# Patient Record
Sex: Male | Born: 1950 | Race: Black or African American | Hispanic: No | Marital: Married | Smoking: Former smoker
Health system: Southern US, Community
[De-identification: ages and names within clinical notes are randomized; demographics above are authoritative.]

## PROBLEM LIST (undated history)

## (undated) DIAGNOSIS — I10 Essential (primary) hypertension: Secondary | ICD-10-CM

## (undated) DIAGNOSIS — J40 Bronchitis, not specified as acute or chronic: Secondary | ICD-10-CM

## (undated) DIAGNOSIS — K259 Gastric ulcer, unspecified as acute or chronic, without hemorrhage or perforation: Secondary | ICD-10-CM

## (undated) DIAGNOSIS — D649 Anemia, unspecified: Secondary | ICD-10-CM

## (undated) DIAGNOSIS — J45909 Unspecified asthma, uncomplicated: Secondary | ICD-10-CM

## (undated) DIAGNOSIS — N189 Chronic kidney disease, unspecified: Secondary | ICD-10-CM

## (undated) DIAGNOSIS — K759 Inflammatory liver disease, unspecified: Secondary | ICD-10-CM

## (undated) DIAGNOSIS — M1990 Unspecified osteoarthritis, unspecified site: Secondary | ICD-10-CM

## (undated) HISTORY — PX: AV FISTULA PLACEMENT: SHX1204

## (undated) HISTORY — PX: UPPER GI ENDOSCOPY: SHX6162

## (undated) HISTORY — DX: Chronic kidney disease, unspecified: N18.9

## (undated) HISTORY — PX: ARTERIOVENOUS GRAFT PLACEMENT: SUR1029

## (undated) HISTORY — PX: COLONOSCOPY: SHX174

## (undated) HISTORY — PX: KNEE ARTHROSCOPY: SHX127

---

## 2000-03-27 ENCOUNTER — Inpatient Hospital Stay (HOSPITAL_COMMUNITY): Admission: EM | Admit: 2000-03-27 | Discharge: 2000-03-31 | Payer: Self-pay | Admitting: Emergency Medicine

## 2004-01-09 ENCOUNTER — Inpatient Hospital Stay (HOSPITAL_COMMUNITY): Admission: EM | Admit: 2004-01-09 | Discharge: 2004-01-11 | Payer: Self-pay | Admitting: Emergency Medicine

## 2004-01-09 ENCOUNTER — Ambulatory Visit: Payer: Self-pay | Admitting: Internal Medicine

## 2006-10-21 ENCOUNTER — Emergency Department (HOSPITAL_COMMUNITY): Admission: EM | Admit: 2006-10-21 | Discharge: 2006-10-21 | Payer: Self-pay | Admitting: Emergency Medicine

## 2010-06-21 NOTE — H&P (Signed)
[facility]  Patient:    Timothy Lane, Timothy Lane                       MRN: [id number] Adm. Date:  [id number] Attending:  Linna Darner CC:         Darrick Penna. Swords, M.D. [facility]   History and Physical  Timothy Lane is a 60 year old Afro-American male admitted with uncontrolled diabetes with glucoses over 800.  Timothy Lane turned 37 on [birth year] 07, 2000 and admits to "partying since."  This has involved unquantitative amounts of alcohol and the use of drugs such as cocaine.  His cigarette consumption has varied from a pack or more depending on "how much coke I have smoked."  He now has been nauseated for four to five days and has vomited on three occasions.  He has had profound polydypsia.  On February 21 he drank three gallons of water, half gallon of juice, three sodas, and four beers by his account.  He has also had associated polyuria and extreme hunger.  Since the eighth or ninth he has had blurred vision.  He has had varying degrees of being "hot and cold," but no definite fever and no purulent secretions.  He had diabetes in 1997 and stated that he was able to control it with diet.  He began to use his glucometer and found glucoses ranging from over 200 to over 400.  Because of this he bought a new glucometer March 25, 2000 which registered "high."  When he called the 1-800 number they told him his sugar was between 600 and 800 and recommended a doctors visit.  He called Dr. Leanne Chang office February 20 and was told he would be worked in February 21.  After waiting for an unquantitative period of time he left without being seen.  PAST MEDICAL HISTORY:  Knee surgery x 2.  He left the [employer] in 1974 with a 10% disability for the right knee.  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   He apparently had a meniscectomy.  He stated this was related to putting "too much weight on my good knee."  He has been disabled for the last 12-13 years.  He states that he will have some itching of his chest and groin area at times and is contemplating having his case reopened to reassess level of disability.  FAMILY HISTORY:  Positive for stroke in his mother.  His father had pancreatic cancer found on autopsy which was done after he was murdered.  ALLERGIES:  No known drug allergies.  SOCIAL HISTORY:  He does not quantitate his alcohol intake but states "I drink whatever is around."  He had a "cold" in his throat one week ago which was not treated.  He denies any secretions.  On one occasion he had pain on the right side of his chest after pushing a couch which he described as "restricted feeling" which was tender to the touch.  He has also had nonspecific variable abdominal pain of a cramping nature with his nausea and vomiting.  He has arthritis in his knees and takes ibuprofen.  He has gout in his big toe and knuckle and takes indomethacin.  He has had a 30 pound weight loss since the diagnosis of diabetes  in 1997.  He describes the itch over the chest.  PHYSICAL EXAMINATION  VITAL SIGNS:  Blood pressure 119/81, pulse 87, respiratory rate 20, temperature 97.6.  HEENT:  He has been arcus senilis.  There is poor visualization of fundi. Dental hygiene is fair.  There is increased cerumen in the otic canals.  The otolaryngologic examination is otherwise unremarkable.  NECK:  No lymphadenopathy about the head, neck, or axilla.  LUNGS:  He has minimal rales at the bases with no increased work of breathing.  HEART:  S4 is present without significant murmur.  ABDOMEN:  Slightly protuberant without organomegaly or masses.  SKIN:  Clear over the abdomen and chest.  GENITAL:  Initially deferred.  RECTAL:  Initially deferred.  EXTREMITIES:  There is crepitus in the  knees.  He has full range of motion and no localizing neurologic signs.  Pedal pulses are good.  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  There is no weeping or drainage.  LABORATORIES:  Blood acetone was negative.  Arterial blood gases on room air showed PO2 95.4, PCO2 32.8, pH 7.484.  Urine revealed greater than 1000 mg/dl of glucose.  CBC and differential revealed a platelet count of 142,000, but was otherwise normal.  His drug screen was positive for cocaine.  Sodium 128, potassium 4.2, glucose 864, BUN 27, creatinine 1.3.  He is admitted with nonketotic uncontrolled diabetes in a setting of drug, alcohol, and nicotine abuse.  He will receive a sliding scale coverage.  With treatment in the emergency room his glucose has already dropped to 397.  There is a family history of stroke and he would be at high risk of premature cardiovascular disease or neurovascular disease.  This was discussed with him.  His chest pain is atypical, but as diabetics seem to have myocardial insult without pain, EKG and enzymes will also be collected.  His prognosis depends on compliance with dietary/medical regimen.  His behavior and lifestyle are antisocial as noted above.  His thought process is disturbing too in that he is considering reevaluation for additional disability for dermatologic symptoms that are most likely related to his uncontrolled diabetes.  He is intelligent and did express that he understood that his prognosis depended on his motivation and involvement.  Following discharge he will be seen by Dr. Leanne Chang at the ______ clinic. DD:  03/27/00 TD:  03/28/00 Job: [id number]

## 2010-06-21 NOTE — Discharge Summary (Signed)
[facility]  Patient:    Timothy Lane, Timothy Lane                       MRN: [id number] Adm. Date:  [id number] Disc. Date: [id number] Attending:  Linna Darner Dictator:   Donaciano Eva, N.P. CC:         Darrick Penna. Swords, M.D. [facility]  Darrick Penna. Linna Darner, M.D. [facility]   Discharge Summary  ADMITTING DIAGNOSES: 1. Nonketotic uncontrolled diabetes. 2. Drug abuse. 3. Alcohol abuse. 4. Nicotine abuse.  DISCHARGE MEDICATIONS: 1. Glucophage XR two p.o. q.a.m. 2. Humulin N 40 units in the a.m., 20 units in the p.m. 3. Humulin regular eight units to each injection of the Humulin N.  DIET:  1600 kcal ADA diet.  FOLLOWUP:  He is to visit his primary physician in one week.  HISTORY OF PRESENT ILLNESS:  The patient was experienced glucoses of above 800, nausea for four to five days, vomiting x 3 days and polydipsia all prior to admission.  LABORATORY DATA AND X-RAY FINDINGS:  Blood glucose of 864, BUN 27, sodium 128. These continued to improve throughout his hospitalization.  Of note, his hemoglobin A1C was 9.9.  His cardiac markers were negative.  His toxicology was positive for cocaine.  HOSPITAL COURSE:  During his hospitalization, his blood sugars continued to normalize using sliding scale therapy.  The patient was instructed on care of his diabetic state.  He received education by the diabetes coordinator as well as the nutritionist.  He demonstrated appropriate technique in injection.  His vital signs were stable.  He was being discharged in stable condition to follow up with his physician for diabetes education in one week. DD:  03/31/00 TD:  04/01/00 Job: [id number]

## 2010-11-14 LAB — I-STAT 8, (EC8 V) (CONVERTED LAB)
Acid-Base Excess: 5 — ABNORMAL HIGH
BUN: 8
Bicarbonate: 32.4 — ABNORMAL HIGH
Chloride: 102
Glucose, Bld: 53 — ABNORMAL LOW
HCT: 49
Hemoglobin: 16.7
Operator id: 288331
Potassium: 3 — ABNORMAL LOW
Sodium: 139
TCO2: 34
pCO2, Ven: 56.5 — ABNORMAL HIGH
pH, Ven: 7.366 — ABNORMAL HIGH

## 2010-11-14 LAB — CBC
HCT: 46.6
Hemoglobin: 16
MCHC: 34.2
MCV: 87.9
Platelets: 133 — ABNORMAL LOW
RBC: 5.3
RDW: 13.2
WBC: 8

## 2010-11-14 LAB — POCT CARDIAC MARKERS
CKMB, poc: <1 — ABNORMAL LOW
CKMB, poc: <1 — ABNORMAL LOW
Myoglobin, poc: 62.3
Myoglobin, poc: 86.5
Operator id: 288331
Operator id: 288331
Troponin i, poc: <0.05
Troponin i, poc: <0.05

## 2010-11-14 LAB — URINALYSIS, ROUTINE W REFLEX MICROSCOPIC
Glucose, UA: >1000 — AB
Hgb urine dipstick: NEGATIVE
Ketones, ur: 15 — AB
Nitrite: POSITIVE — AB
Protein, ur: 100 — AB
Specific Gravity, Urine: 1.025
Urobilinogen, UA: 1
pH: 5

## 2010-11-14 LAB — DIFFERENTIAL
Basophils Absolute: 0
Basophils Relative: 0
Eosinophils Absolute: 0.3
Eosinophils Relative: 3
Lymphocytes Relative: 20
Lymphs Abs: 1.6
Monocytes Absolute: 0.7
Monocytes Relative: 9
Neutro Abs: 5.4
Neutrophils Relative %: 67

## 2010-11-14 LAB — HEPATIC FUNCTION PANEL
ALT: 33
AST: 28
Albumin: 4
Alkaline Phosphatase: 100
Bilirubin, Direct: 0.2
Indirect Bilirubin: 1.3 — ABNORMAL HIGH
Total Bilirubin: 1.5 — ABNORMAL HIGH
Total Protein: 7.7

## 2010-11-14 LAB — LIPASE, BLOOD: Lipase: 32

## 2010-11-14 LAB — POCT I-STAT CREATININE
Creatinine, Ser: 1.4
Operator id: 288331

## 2010-11-14 LAB — AMYLASE: Amylase: 111

## 2010-11-14 LAB — URINE MICROSCOPIC-ADD ON

## 2010-11-14 LAB — ETHANOL

## 2010-11-14 LAB — URINE CULTURE: Colony Count: 15000

## 2011-07-24 ENCOUNTER — Other Ambulatory Visit: Payer: Self-pay | Admitting: Orthopedic Surgery

## 2011-08-08 ENCOUNTER — Ambulatory Visit

## 2011-09-01 ENCOUNTER — Encounter (HOSPITAL_COMMUNITY)
Admission: RE | Admit: 2011-09-01 | Discharge: 2011-09-01 | Payer: Self-pay | Source: Ambulatory Visit | Attending: Orthopedic Surgery | Admitting: Orthopedic Surgery

## 2011-09-01 NOTE — Pre-Procedure Instructions (Signed)
[place]  09/01/2011   Your procedure is scheduled on:  Mon, Aug 5 @ 7:30 AM  Report to [place] at 5:30 AM.  Call this number if you have problems the morning of surgery: [phone]   Remember:   Do not eat food:After Midnight.               Do not wear jewelry  Do not wear lotions, powders, or colognes.  Men may shave face and neck.  Do not bring valuables to the hospital.  Contacts, dentures or bridgework may not be worn into surgery.  Leave suitcase in the car. After surgery it may be brought to your room.  For patients admitted to the hospital, checkout time is 11:00 AM the day of discharge.   Patients discharged the day of surgery will not be allowed to drive home.    Special Instructions: Incentive Spirometry - Practice and bring it with you on the day of surgery. and CHG Shower Use Special Wash: 1/2 bottle night before surgery and 1/2 bottle morning of surgery.   Please read over the following fact sheets that you were given: Pain Booklet, Coughing and Deep Breathing, Blood Transfusion Information, Total Joint Packet, MRSA Information and Surgical Site Infection Prevention

## 2011-09-02 ENCOUNTER — Encounter (HOSPITAL_COMMUNITY): Payer: Self-pay | Admitting: Pharmacy Technician

## 2011-09-04 ENCOUNTER — Encounter (HOSPITAL_COMMUNITY)
Admission: RE | Admit: 2011-09-04 | Discharge: 2011-09-04 | Disposition: A | Discharge status: Home / Self Care | Source: Ambulatory Visit | Attending: Orthopedic Surgery | Admitting: Orthopedic Surgery

## 2011-09-04 ENCOUNTER — Encounter (HOSPITAL_COMMUNITY): Payer: Self-pay

## 2011-09-04 HISTORY — DX: Essential (primary) hypertension: I10

## 2011-09-04 HISTORY — DX: Bronchitis, not specified as acute or chronic: J40

## 2011-09-04 HISTORY — DX: Unspecified asthma, uncomplicated: J45.909

## 2011-09-04 HISTORY — DX: Inflammatory liver disease, unspecified: K75.9

## 2011-09-04 HISTORY — DX: Unspecified osteoarthritis, unspecified site: M19.90

## 2011-09-04 HISTORY — DX: Gastric ulcer, unspecified as acute or chronic, without hemorrhage or perforation: K25.9

## 2011-09-04 LAB — ABO/RH: ABO/RH(D): O POS

## 2011-09-04 LAB — DIFFERENTIAL
Eosinophils Relative: 17 % — ABNORMAL HIGH (ref 0–5)
Lymphocytes Relative: 26 % (ref 12–46)
Lymphs Abs: 1.1 10*3/uL (ref 0.7–4.0)
Monocytes Absolute: 0.3 10*3/uL (ref 0.1–1.0)
Neutro Abs: 2.1 10*3/uL (ref 1.7–7.7)

## 2011-09-04 LAB — URINALYSIS, ROUTINE W REFLEX MICROSCOPIC
Hgb urine dipstick: NEGATIVE
Leukocytes, UA: NEGATIVE
Specific Gravity, Urine: 1.015 (ref 1.005–1.030)
Urobilinogen, UA: 1 mg/dL (ref 0.0–1.0)

## 2011-09-04 LAB — CBC
MCH: 31 pg (ref 26.0–34.0)
MCHC: 35 g/dL (ref 30.0–36.0)
Platelets: 109 10*3/uL — ABNORMAL LOW (ref 150–400)
RDW: 13.7 % (ref 11.5–15.5)

## 2011-09-04 LAB — COMPREHENSIVE METABOLIC PANEL
ALT: 76 U/L — ABNORMAL HIGH (ref 0–53)
AST: 74 U/L — ABNORMAL HIGH (ref 0–37)
Calcium: 9.7 mg/dL (ref 8.4–10.5)
GFR calc Af Amer: 74 mL/min — ABNORMAL LOW (ref >90)
Sodium: 142 mEq/L (ref 135–145)
Total Protein: 7.6 g/dL (ref 6.0–8.3)

## 2011-09-04 LAB — PROTIME-INR: Prothrombin Time: 12.9 seconds (ref 11.6–15.2)

## 2011-09-04 LAB — SURGICAL PCR SCREEN
MRSA, PCR: NEGATIVE
Staphylococcus aureus: NEGATIVE

## 2011-09-04 MED ORDER — CHLORHEXIDINE GLUCONATE 4 % EX LIQD: 60.0000 mL | Freq: Once | CUTANEOUS | Status: DC

## 2011-09-07 MED ORDER — CEFAZOLIN SODIUM-DEXTROSE 2-3 GM-% IV SOLR
2.0000 g | INTRAVENOUS | Status: AC
  Administered 2011-09-08: 2 g via INTRAVENOUS
  Filled 2011-09-07: qty 50

## 2011-09-08 ENCOUNTER — Ambulatory Visit (HOSPITAL_COMMUNITY): Admitting: Anesthesiology

## 2011-09-08 ENCOUNTER — Encounter (HOSPITAL_COMMUNITY)
Admission: RE | Disposition: A | Discharge status: Home / Self Care | Payer: Self-pay | Source: Ambulatory Visit | Attending: Orthopedic Surgery

## 2011-09-08 ENCOUNTER — Inpatient Hospital Stay (HOSPITAL_COMMUNITY)
Admission: RE | Admit: 2011-09-08 | Discharge: 2011-09-11 | DRG: 470 | Disposition: A | Discharge status: Home / Self Care | Source: Ambulatory Visit | Attending: Orthopedic Surgery | Admitting: Orthopedic Surgery

## 2011-09-08 ENCOUNTER — Encounter (HOSPITAL_COMMUNITY): Payer: Self-pay | Admitting: Anesthesiology

## 2011-09-08 ENCOUNTER — Encounter (HOSPITAL_COMMUNITY): Payer: Self-pay | Admitting: *Deleted

## 2011-09-08 ENCOUNTER — Encounter (HOSPITAL_COMMUNITY): Payer: Self-pay

## 2011-09-08 DIAGNOSIS — B1920 Unspecified viral hepatitis C without hepatic coma: Secondary | ICD-10-CM

## 2011-09-08 DIAGNOSIS — E1165 Type 2 diabetes mellitus with hyperglycemia: Secondary | ICD-10-CM

## 2011-09-08 DIAGNOSIS — E1149 Type 2 diabetes mellitus with other diabetic neurological complication: Secondary | ICD-10-CM | POA: Diagnosis present

## 2011-09-08 DIAGNOSIS — Z96659 Presence of unspecified artificial knee joint: Secondary | ICD-10-CM

## 2011-09-08 DIAGNOSIS — I10 Essential (primary) hypertension: Secondary | ICD-10-CM | POA: Diagnosis present

## 2011-09-08 DIAGNOSIS — M1711 Unilateral primary osteoarthritis, right knee: Secondary | ICD-10-CM

## 2011-09-08 DIAGNOSIS — Z720 Tobacco use: Secondary | ICD-10-CM

## 2011-09-08 DIAGNOSIS — D62 Acute posthemorrhagic anemia: Secondary | ICD-10-CM | POA: Diagnosis not present

## 2011-09-08 DIAGNOSIS — E1140 Type 2 diabetes mellitus with diabetic neuropathy, unspecified: Secondary | ICD-10-CM

## 2011-09-08 DIAGNOSIS — Z8711 Personal history of peptic ulcer disease: Secondary | ICD-10-CM

## 2011-09-08 DIAGNOSIS — Z01812 Encounter for preprocedural laboratory examination: Secondary | ICD-10-CM

## 2011-09-08 DIAGNOSIS — E1142 Type 2 diabetes mellitus with diabetic polyneuropathy: Secondary | ICD-10-CM | POA: Diagnosis present

## 2011-09-08 DIAGNOSIS — J45909 Unspecified asthma, uncomplicated: Secondary | ICD-10-CM | POA: Diagnosis present

## 2011-09-08 DIAGNOSIS — M1710 Unilateral primary osteoarthritis, unspecified knee: Principal | ICD-10-CM | POA: Diagnosis present

## 2011-09-08 DIAGNOSIS — F17200 Nicotine dependence, unspecified, uncomplicated: Secondary | ICD-10-CM | POA: Diagnosis present

## 2011-09-08 HISTORY — PX: TOTAL KNEE ARTHROPLASTY: SHX125

## 2011-09-08 LAB — GLUCOSE, CAPILLARY
Glucose-Capillary: 129 mg/dL — ABNORMAL HIGH (ref 70–99)
Glucose-Capillary: 159 mg/dL — ABNORMAL HIGH (ref 70–99)
Glucose-Capillary: 210 mg/dL — ABNORMAL HIGH (ref 70–99)

## 2011-09-08 LAB — HEMOGLOBIN A1C: Mean Plasma Glucose: 160 mg/dL — ABNORMAL HIGH (ref <117)

## 2011-09-08 SURGERY — ARTHROPLASTY, KNEE, TOTAL
Anesthesia: Regional | Site: Knee | Laterality: Left | Wound class: Clean

## 2011-09-08 MED ORDER — ACETAMINOPHEN 10 MG/ML IV SOLN
1000.0000 mg | Freq: Four times a day (QID) | INTRAVENOUS | Status: DC
  Administered 2011-09-08: 500 mg via INTRAVENOUS
  Filled 2011-09-08 (×3): qty 100

## 2011-09-08 MED ORDER — ONDANSETRON HCL 4 MG/2ML IJ SOLN
4.0000 mg | Freq: Four times a day (QID) | INTRAMUSCULAR | Status: DC | PRN
  Administered 2011-09-08 (×2): 4 mg via INTRAVENOUS
  Filled 2011-09-08 (×2): qty 2

## 2011-09-08 MED ORDER — FENTANYL CITRATE 0.05 MG/ML IJ SOLN
INTRAMUSCULAR | Status: DC | PRN
  Administered 2011-09-08: 100 ug via INTRAVENOUS
  Administered 2011-09-08 (×2): 50 ug via INTRAVENOUS

## 2011-09-08 MED ORDER — LIDOCAINE HCL (CARDIAC) 20 MG/ML IV SOLN
INTRAVENOUS | Status: DC | PRN
  Administered 2011-09-08: 80 mg via INTRAVENOUS

## 2011-09-08 MED ORDER — ACETAMINOPHEN 325 MG PO TABS: 650.0000 mg | ORAL_TABLET | Freq: Four times a day (QID) | ORAL | Status: DC | PRN

## 2011-09-08 MED ORDER — LACTATED RINGERS IV SOLN
INTRAVENOUS | Status: DC | PRN
  Administered 2011-09-08 (×2): via INTRAVENOUS

## 2011-09-08 MED ORDER — ONDANSETRON HCL 4 MG/2ML IJ SOLN
INTRAMUSCULAR | Status: DC | PRN
  Administered 2011-09-08: 4 mg via INTRAVENOUS

## 2011-09-08 MED ORDER — LISINOPRIL 20 MG PO TABS
20.0000 mg | ORAL_TABLET | Freq: Every day | ORAL | Status: DC
  Administered 2011-09-08 – 2011-09-11 (×4): 20 mg via ORAL
  Filled 2011-09-08 (×4): qty 1

## 2011-09-08 MED ORDER — ENOXAPARIN SODIUM 30 MG/0.3ML ~~LOC~~ SOLN
30.0000 mg | Freq: Two times a day (BID) | SUBCUTANEOUS | Status: DC
  Administered 2011-09-09 – 2011-09-11 (×5): 30 mg via SUBCUTANEOUS
  Filled 2011-09-08 (×8): qty 0.3

## 2011-09-08 MED ORDER — OXYCODONE HCL 10 MG PO TB12
10.0000 mg | ORAL_TABLET | Freq: Two times a day (BID) | ORAL | Status: DC
  Administered 2011-09-08 – 2011-09-11 (×7): 10 mg via ORAL
  Filled 2011-09-08 (×7): qty 1

## 2011-09-08 MED ORDER — GABAPENTIN 100 MG PO CAPS
200.0000 mg | ORAL_CAPSULE | Freq: Three times a day (TID) | ORAL | Status: DC
  Administered 2011-09-08 – 2011-09-11 (×9): 200 mg via ORAL
  Filled 2011-09-08 (×12): qty 2

## 2011-09-08 MED ORDER — INSULIN ASPART 100 UNIT/ML ~~LOC~~ SOLN
0.0000 [IU] | Freq: Every day | SUBCUTANEOUS | Status: DC
  Administered 2011-09-08: 2 [IU] via SUBCUTANEOUS
  Administered 2011-09-09: 4 [IU] via SUBCUTANEOUS

## 2011-09-08 MED ORDER — BUPIVACAINE-EPINEPHRINE 0.25% -1:200000 IJ SOLN
INTRAMUSCULAR | Status: DC | PRN
  Administered 2011-09-08: 20 mL

## 2011-09-08 MED ORDER — ROCURONIUM BROMIDE 100 MG/10ML IV SOLN
INTRAVENOUS | Status: DC | PRN
  Administered 2011-09-08: 50 mg via INTRAVENOUS

## 2011-09-08 MED ORDER — LIDOCAINE HCL 4 % MT SOLN
OROMUCOSAL | Status: DC | PRN
  Administered 2011-09-08: 4 mL via TOPICAL

## 2011-09-08 MED ORDER — METHOCARBAMOL 100 MG/ML IJ SOLN
500.0000 mg | Freq: Four times a day (QID) | INTRAVENOUS | Status: DC | PRN
  Filled 2011-09-08: qty 5

## 2011-09-08 MED ORDER — METOCLOPRAMIDE HCL 5 MG/ML IJ SOLN: 5.0000 mg | Freq: Three times a day (TID) | INTRAMUSCULAR | Status: DC | PRN

## 2011-09-08 MED ORDER — PHENOL 1.4 % MT LIQD: 1.0000 | OROMUCOSAL | Status: DC | PRN

## 2011-09-08 MED ORDER — ALUM & MAG HYDROXIDE-SIMETH 200-200-20 MG/5ML PO SUSP: 30.0000 mL | ORAL | Status: DC | PRN

## 2011-09-08 MED ORDER — ACETAMINOPHEN 10 MG/ML IV SOLN
INTRAVENOUS | Status: AC
  Filled 2011-09-08: qty 100

## 2011-09-08 MED ORDER — BISACODYL 5 MG PO TBEC
5.0000 mg | DELAYED_RELEASE_TABLET | Freq: Every day | ORAL | Status: DC | PRN
  Administered 2011-09-10: 5 mg via ORAL
  Filled 2011-09-08: qty 1

## 2011-09-08 MED ORDER — OXYCODONE HCL 5 MG PO TABS
5.0000 mg | ORAL_TABLET | ORAL | Status: DC | PRN
  Administered 2011-09-09 – 2011-09-11 (×7): 10 mg via ORAL
  Filled 2011-09-08 (×7): qty 2

## 2011-09-08 MED ORDER — SENNOSIDES-DOCUSATE SODIUM 8.6-50 MG PO TABS: 1.0000 | ORAL_TABLET | Freq: Every evening | ORAL | Status: DC | PRN

## 2011-09-08 MED ORDER — HYDROMORPHONE HCL PF 1 MG/ML IJ SOLN
INTRAMUSCULAR | Status: AC
  Filled 2011-09-08: qty 1

## 2011-09-08 MED ORDER — DIPHENHYDRAMINE HCL 12.5 MG/5ML PO ELIX: 12.5000 mg | ORAL_SOLUTION | ORAL | Status: DC | PRN

## 2011-09-08 MED ORDER — CELECOXIB 200 MG PO CAPS
200.0000 mg | ORAL_CAPSULE | Freq: Two times a day (BID) | ORAL | Status: DC
  Administered 2011-09-08 – 2011-09-11 (×7): 200 mg via ORAL
  Filled 2011-09-08 (×8): qty 1

## 2011-09-08 MED ORDER — ONDANSETRON HCL 4 MG PO TABS
4.0000 mg | ORAL_TABLET | Freq: Four times a day (QID) | ORAL | Status: DC | PRN
  Administered 2011-09-09 – 2011-09-11 (×4): 4 mg via ORAL
  Filled 2011-09-08 (×4): qty 1

## 2011-09-08 MED ORDER — GLYCOPYRROLATE 0.2 MG/ML IJ SOLN
INTRAMUSCULAR | Status: DC | PRN
  Administered 2011-09-08: .8 mg via INTRAVENOUS

## 2011-09-08 MED ORDER — BUPIVACAINE 0.25 % ON-Q PUMP SINGLE CATH 300ML
300.0000 mL | INJECTION | Status: AC
  Administered 2011-09-08: 300 mL
  Filled 2011-09-08: qty 300

## 2011-09-08 MED ORDER — INSULIN ASPART 100 UNIT/ML ~~LOC~~ SOLN
4.0000 [IU] | Freq: Three times a day (TID) | SUBCUTANEOUS | Status: DC
  Administered 2011-09-09 (×3): 4 [IU] via SUBCUTANEOUS

## 2011-09-08 MED ORDER — DOCUSATE SODIUM 100 MG PO CAPS
100.0000 mg | ORAL_CAPSULE | Freq: Two times a day (BID) | ORAL | Status: DC
Start: 2011-09-08 — End: 2011-09-11
  Administered 2011-09-08 – 2011-09-11 (×6): 100 mg via ORAL
  Filled 2011-09-08 (×7): qty 1

## 2011-09-08 MED ORDER — ACETAMINOPHEN 650 MG RE SUPP: 650.0000 mg | Freq: Four times a day (QID) | RECTAL | Status: DC | PRN

## 2011-09-08 MED ORDER — INSULIN ASPART 100 UNIT/ML ~~LOC~~ SOLN
0.0000 [IU] | Freq: Three times a day (TID) | SUBCUTANEOUS | Status: DC
  Administered 2011-09-08 (×2): 2 [IU] via SUBCUTANEOUS
  Administered 2011-09-09: 5 [IU] via SUBCUTANEOUS
  Administered 2011-09-09 (×2): 2 [IU] via SUBCUTANEOUS

## 2011-09-08 MED ORDER — SODIUM CHLORIDE 0.9 % IR SOLN
Status: DC | PRN
  Administered 2011-09-08: 3000 mL
  Administered 2011-09-08: 1000 mL

## 2011-09-08 MED ORDER — MIDAZOLAM HCL 5 MG/5ML IJ SOLN
INTRAMUSCULAR | Status: DC | PRN
  Administered 2011-09-08: 2 mg via INTRAVENOUS

## 2011-09-08 MED ORDER — NEOSTIGMINE METHYLSULFATE 1 MG/ML IJ SOLN
INTRAMUSCULAR | Status: DC | PRN
  Administered 2011-09-08: 5 mg via INTRAVENOUS

## 2011-09-08 MED ORDER — METOCLOPRAMIDE HCL 10 MG PO TABS: 5.0000 mg | ORAL_TABLET | Freq: Three times a day (TID) | ORAL | Status: DC | PRN

## 2011-09-08 MED ORDER — METHOCARBAMOL 500 MG PO TABS
500.0000 mg | ORAL_TABLET | Freq: Four times a day (QID) | ORAL | Status: DC | PRN
  Administered 2011-09-08 – 2011-09-10 (×5): 500 mg via ORAL
  Filled 2011-09-08 (×5): qty 1

## 2011-09-08 MED ORDER — BUPIVACAINE-EPINEPHRINE PF 0.25-1:200000 % IJ SOLN
INTRAMUSCULAR | Status: AC
  Filled 2011-09-08: qty 30

## 2011-09-08 MED ORDER — FLEET ENEMA 7-19 GM/118ML RE ENEM: 1.0000 | ENEMA | Freq: Once | RECTAL | Status: AC | PRN

## 2011-09-08 MED ORDER — PROPOFOL 10 MG/ML IV EMUL
INTRAVENOUS | Status: DC | PRN
  Administered 2011-09-08: 200 mg via INTRAVENOUS

## 2011-09-08 MED ORDER — ONDANSETRON HCL 4 MG/2ML IJ SOLN: 4.0000 mg | Freq: Once | INTRAMUSCULAR | Status: DC | PRN

## 2011-09-08 MED ORDER — HYDROCHLOROTHIAZIDE 25 MG PO TABS
25.0000 mg | ORAL_TABLET | Freq: Every day | ORAL | Status: DC
  Administered 2011-09-08 – 2011-09-11 (×4): 25 mg via ORAL
  Filled 2011-09-08 (×4): qty 1

## 2011-09-08 MED ORDER — SODIUM CHLORIDE 0.9 % IV SOLN
INTRAVENOUS | Status: DC
  Administered 2011-09-09 (×2): via INTRAVENOUS

## 2011-09-08 MED ORDER — MENTHOL 3 MG MT LOZG: 1.0000 | LOZENGE | OROMUCOSAL | Status: DC | PRN

## 2011-09-08 MED ORDER — MORPHINE SULFATE 4 MG/ML IJ SOLN
4.0000 mg | INTRAMUSCULAR | Status: DC | PRN
Start: 2011-09-08 — End: 2011-09-10
  Administered 2011-09-08 – 2011-09-10 (×8): 4 mg via INTRAVENOUS
  Filled 2011-09-08 (×8): qty 1

## 2011-09-08 MED ORDER — HYDROMORPHONE HCL PF 1 MG/ML IJ SOLN
1.0000 mg | INTRAMUSCULAR | Status: DC | PRN
  Administered 2011-09-08 (×3): 1 mg via INTRAVENOUS
  Filled 2011-09-08 (×3): qty 1

## 2011-09-08 MED ORDER — HYDROMORPHONE HCL PF 1 MG/ML IJ SOLN
0.2500 mg | INTRAMUSCULAR | Status: DC | PRN
  Administered 2011-09-08 (×3): 0.5 mg via INTRAVENOUS

## 2011-09-08 MED ORDER — ZOLPIDEM TARTRATE 5 MG PO TABS: 5.0000 mg | ORAL_TABLET | Freq: Every evening | ORAL | Status: DC | PRN

## 2011-09-08 MED ORDER — SODIUM CHLORIDE 0.9 % IV SOLN: INTRAVENOUS | Status: DC

## 2011-09-08 SURGICAL SUPPLY — 63 items
BANDAGE ESMARK 6X9 LF (GAUZE/BANDAGES/DRESSINGS) ×1 IMPLANT
BLADE SAGITTAL 13X1.27X60 (BLADE) ×2 IMPLANT
BLADE SAW SGTL 83.5X18.5 (BLADE) ×2 IMPLANT
BNDG CMPR 9X6 STRL LF SNTH (GAUZE/BANDAGES/DRESSINGS) ×1
BNDG ESMARK 6X9 LF (GAUZE/BANDAGES/DRESSINGS) ×2
BOWL SMART MIX CTS (DISPOSABLE) ×2 IMPLANT
CATH KIT ON Q 5IN SLV (PAIN MANAGEMENT) ×2 IMPLANT
CEMENT BONE SIMPLEX SPEEDSET (Cement) ×4 IMPLANT
CLOTH BEACON ORANGE TIMEOUT ST (SAFETY) ×2 IMPLANT
COVER BACK TABLE 24X17X13 BIG (DRAPES) ×2 IMPLANT
COVER SURGICAL LIGHT HANDLE (MISCELLANEOUS) ×2 IMPLANT
CUFF TOURNIQUET SINGLE 34IN LL (TOURNIQUET CUFF) ×2 IMPLANT
DRAPE EXTREMITY T 121X128X90 (DRAPE) ×2 IMPLANT
DRAPE INCISE IOBAN 66X45 STRL (DRAPES) ×4 IMPLANT
DRAPE PROXIMA HALF (DRAPES) ×2 IMPLANT
DRAPE U-SHAPE 47X51 STRL (DRAPES) ×2 IMPLANT
DRSG ADAPTIC 3X8 NADH LF (GAUZE/BANDAGES/DRESSINGS) IMPLANT
DRSG PAD ABDOMINAL 8X10 ST (GAUZE/BANDAGES/DRESSINGS) ×2 IMPLANT
DURAPREP 26ML APPLICATOR (WOUND CARE) ×4 IMPLANT
ELECT REM PT RETURN 9FT ADLT (ELECTROSURGICAL) ×2
ELECTRODE REM PT RTRN 9FT ADLT (ELECTROSURGICAL) ×1 IMPLANT
EVACUATOR 1/8 PVC DRAIN (DRAIN) IMPLANT
GLOVE BIOGEL M 7.0 STRL (GLOVE) IMPLANT
GLOVE BIOGEL PI IND STRL 7.5 (GLOVE) IMPLANT
GLOVE BIOGEL PI IND STRL 8 (GLOVE) ×1 IMPLANT
GLOVE BIOGEL PI IND STRL 8.5 (GLOVE) ×2 IMPLANT
GLOVE BIOGEL PI INDICATOR 7.5 (GLOVE)
GLOVE BIOGEL PI INDICATOR 8 (GLOVE) ×1
GLOVE BIOGEL PI INDICATOR 8.5 (GLOVE) ×2
GLOVE SURG ORTHO 8.0 STRL STRW (GLOVE) ×4 IMPLANT
GLOVE SURG SS PI 7.5 STRL IVOR (GLOVE) ×2 IMPLANT
GOWN PREVENTION PLUS XLARGE (GOWN DISPOSABLE) ×4 IMPLANT
GOWN PREVENTION PLUS XXLARGE (GOWN DISPOSABLE) ×2 IMPLANT
GOWN STRL NON-REIN LRG LVL3 (GOWN DISPOSABLE) IMPLANT
HANDPIECE INTERPULSE COAX TIP (DISPOSABLE) ×2
HOOD PEEL AWAY FACE SHEILD DIS (HOOD) ×6 IMPLANT
KIT BASIN OR (CUSTOM PROCEDURE TRAY) ×2 IMPLANT
KIT ROOM TURNOVER OR (KITS) ×2 IMPLANT
MANIFOLD NEPTUNE II (INSTRUMENTS) ×2 IMPLANT
NEEDLE 22X1 1/2 (OR ONLY) (NEEDLE) IMPLANT
NS IRRIG 1000ML POUR BTL (IV SOLUTION) ×2 IMPLANT
PACK TOTAL JOINT (CUSTOM PROCEDURE TRAY) ×2 IMPLANT
PAD ARMBOARD 7.5X6 YLW CONV (MISCELLANEOUS) ×2 IMPLANT
PAD CAST 4YDX4 CTTN HI CHSV (CAST SUPPLIES) ×1 IMPLANT
PADDING CAST COTTON 4X4 STRL (CAST SUPPLIES) ×2
PADDING CAST COTTON 6X4 STRL (CAST SUPPLIES) IMPLANT
POSITIONER HEAD PRONE TRACH (MISCELLANEOUS) ×2 IMPLANT
SET HNDPC FAN SPRY TIP SCT (DISPOSABLE) ×1 IMPLANT
SPONGE GAUZE 4X4 12PLY (GAUZE/BANDAGES/DRESSINGS) IMPLANT
STAPLER VISISTAT 35W (STAPLE) ×2 IMPLANT
SUCTION FRAZIER TIP 10 FR DISP (SUCTIONS) ×2 IMPLANT
SUT BONE WAX W31G (SUTURE) ×2 IMPLANT
SUT VIC AB 0 CTB1 27 (SUTURE) ×4 IMPLANT
SUT VIC AB 1 CT1 27 (SUTURE) ×2
SUT VIC AB 1 CT1 27XBRD ANBCTR (SUTURE) ×1 IMPLANT
SUT VIC AB 2-0 CT1 27 (SUTURE) ×4
SUT VIC AB 2-0 CT1 TAPERPNT 27 (SUTURE) ×2 IMPLANT
SUT VLOC 180 0 24IN GS25 (SUTURE) ×2 IMPLANT
SYR CONTROL 10ML LL (SYRINGE) IMPLANT
TOWEL OR 17X24 6PK STRL BLUE (TOWEL DISPOSABLE) ×2 IMPLANT
TOWEL OR 17X26 10 PK STRL BLUE (TOWEL DISPOSABLE) ×2 IMPLANT
TRAY FOLEY CATH 14FR (SET/KITS/TRAYS/PACK) IMPLANT
WATER STERILE IRR 1000ML POUR (IV SOLUTION) ×4 IMPLANT

## 2011-09-08 NOTE — Progress Notes (Signed)
Dr. Conrad [place] notified patient drank 8 oz water at 0530. Stated to go ahead and send over when ready.

## 2011-09-08 NOTE — Progress Notes (Signed)
Orthopedic Tech Progress Note Patient Details:  Timothy Lane 03/08/1950 [id number]  CPM Left Knee CPM Left Knee: On Left Knee Flexion (Degrees): 90  Left Knee Extension (Degrees): 0  Additional Comments: trapeze bar   Cammer, Theodoro Parma 09/08/2011, 10:09 AM

## 2011-09-08 NOTE — Anesthesia Procedure Notes (Signed)
Anesthesia Regional Block:  Femoral nerve block  Pre-Anesthetic Checklist: ,, timeout performed, Correct Patient, Correct Site, Correct Laterality, Correct Procedure, Correct Position, site marked, Risks and benefits discussed,  Surgical consent,  Pre-op evaluation,  At surgeon's request and post-op pain management  Laterality: Left  Prep: Maximum Sterile Barrier Precautions used, chloraprep and alcohol swabs       Needles:  Injection technique: Single-shot  Needle Type: Stimulator Needle - 80        Needle insertion depth: 4 cm   Additional Needles:  Procedures: nerve stimulator Femoral nerve block  Nerve Stimulator or Paresthesia:  Response: 0.5 mA, 0.1 ms, 4 cm  Additional Responses:   Narrative:  Start time: 09/08/2011 7:10 AM End time: 09/08/2011 7:15 AM Injection made incrementally with aspirations every 5 mL.  Performed by: Personally  Anesthesiologist: Sharolyn Douglas MD  Additional Notes: Risks of procedure explained to pt and accepts. 22cc 0.5% Marcaine w/ epi w/o discomfort or difficulty.  GES

## 2011-09-08 NOTE — Progress Notes (Signed)
Pt still with inability to void.  In and out cathed with 800cc output clear, yellow urine.  Pt reports relief.

## 2011-09-08 NOTE — Progress Notes (Signed)
Pt unable to void.  Assisted to bathroom to attempt to void without success.  Upon activity OOB and sitting on commode, pt has increased bloody drainage to anterior dressing.  Dressing reinforced with ABD pads and ACE wrap.  Hemovac in place to suction.  Continue to monitor.

## 2011-09-08 NOTE — Progress Notes (Signed)
UR COMPLETED  

## 2011-09-08 NOTE — Op Note (Signed)
TOTAL KNEE REPLACEMENT OPERATIVE NOTE:  09/08/2011  1:32 PM  PATIENT:  Timothy Lane  61 y.o. male  PRE-OPERATIVE DIAGNOSIS:  osteoarthritis left knee  POST-OPERATIVE DIAGNOSIS:  osteoarthritis left knee  PROCEDURE:  Procedure(s): TOTAL KNEE ARTHROPLASTY  SURGEON:  Surgeon(s): Rudean Haskell, MD  PHYSICIAN ASSISTANT: Carlynn Spry, [facility]  ANESTHESIA:   general  DRAINS: Hemovac and On-Q Marcaine Pain Pump  SPECIMEN: None  COUNTS:  Correct  TOURNIQUET:   Total Tourniquet Time Documented: Thigh (Left) - 53 minutes  DICTATION:  Indication for procedure:    The patient is a 61 y.o. male who has failed conservative treatment for osteoarthritis left knee.  Informed consent was obtained prior to anesthesia. The risks versus benefits of the operation were explain and in a way the patient can, and did, understand.   Description of procedure:     The patient was taken to the operating room and placed under anesthesia.  The patient was positioned in the usual fashion taking care that all body parts were adequately padded and/or protected.  I foley catheter was not placed.  A tourniquet was applied and the leg prepped and draped in the usual sterile fashion.  The extremity was exsanguinated with the esmarch and tourniquet inflated to 350 mmHg.  Pre-operative range of motion was normal.  The knee was in 5 degree of significant varus.  A midline incision approximately 6-7 inches long was made with a #10 blade.  A new blade was used to make a parapatellar arthrotomy going 2-3 cm into the quadriceps tendon, over the patella, and alongside the medial aspect of the patellar tendon.  A synovectomy was then performed with the #10 blade and forceps. I then elevated the deep MCL off the medial tibial metaphysis subperiosteally around to the semimembranosus attachment.    I everted the patella and used calipers to measure patellar thickness.  I used the reamer to ream down to appropriate thickness  to recreate the native thickness.  I then removed excess bone with the rongeur and sagittal saw.  I used the appropriately sized template and drilled the three lug holes.  I then put the trial in place and measured the thickness with the calipers to ensure recreation of the native thickness.  The trial was then removed and the patella subluxed and the knee brought into flexion.  A homan retractor was place to retract and protect the patella and lateral structures.  A Z-retractor was place medially to protect the medial structures.  The extra-medullary alignment system was used to make cut the tibial articular surface perpendicular to the anamotic axis of the tibia and in 3 degrees of posterior slope.  The cut surface and alignment jig was removed.  I then used the intramedullary alignment guide to make a 6 valgus cut on the distal femur.  I then marked out the epicondylar axis on the distal femur.  The posterior condylar axis measured 3 degrees.  I then used the anterior referencing sizer and measured the femur to be a size F.  The 4-In-1 cutting block was screwed into place in external rotation matching the posterior condylar angle, making our cuts perpendicular to the epicondylar axis.  Anterior, posterior and chamfer cuts were made with the sagittal saw.  The cutting block and cut pieces were removed.  A lamina spreader was placed in 90 degrees of flexion.  The ACL, PCL, menisci, and posterior condylar osteophytes were removed.  A 10 mm spacer blocked was found to offer good  flexion and extension gap balance after minimal in degree releasing.   The scoop retractor was then placed and the femoral finishing block was pinned in place.  The small sagittal saw was used as well as the lug drill to finish the femur.  The block and cut surfaces were removed and the medullary canal hole filled with autograft bone from the cut pieces.  The tibia was delivered forward in deep flexion and external rotation.  A size 8  tray was selected and pinned into place centered on the medial 1/3 of the tibial tubercle.  The reamer and keel was used to prepare the tibia through the tray.    I then trialed with the size F femur, size 8 tibia, a 10 mm insert and the 38 patella.  I had excellent flexion/extension gap balance, excellent patella tracking.  Flexion was full and beyond 120 degrees; extension was zero.  These components were chosen and the staff opened them to me on the back table while the knee was lavaged copiously and the cement mixed.  I cemented in the components and removed all excess cement.  The polyethylene tibial component was snapped into place and the knee placed in extension while cement was hardening.  The capsule was infilltrated with 20cc of .25% Marcaine with epinephrine.  A hemovac was place in the joint exiting superolaterally.  A pain pump was place superomedially superficial to the arthrotomy.  Once the cement was hard, the tourniquet was let down.  Hemostasis was obtained.  The arthrotomy was closed with figure-8 #1 vicryl sutures.  The deep soft tissues were closed with #0 vicryls and the subcuticular layer closed with a running #2-0 vicryl.  The skin was reapproximated and closed with skin staples.  The wound was dressed with xeroform, 4 x4's, 2 ABD sponges, a single layer of webril and a TED stocking.   The patient was then awakened, extubated, and taken to the recovery room in stable condition.  BLOOD LOSS:  300cc DRAINS: 1 hemovac, 1 pain catheter COMPLICATIONS:  None.  PLAN OF CARE: Admit to inpatient   PATIENT DISPOSITION:  PACU - hemodynamically stable.   Delay start of Pharmacological VTE agent (>24hrs) due to surgical blood loss or risk of bleeding:  not applicable  Please fax a copy of this op note to my office at [phone] (please only include page 1 and 2 of the Case Information op note)

## 2011-09-08 NOTE — Preoperative (Signed)
Beta Blockers   Reason not to administer Beta Blockers:Not Applicable 

## 2011-09-08 NOTE — H&P (Signed)
Timothy Lane MRN:  [id number] DOB/SEX:  [birth year]/male  CHIEF COMPLAINT:  Painful left Knee  HISTORY: Patient is a 61 y.o. male presented with a history of pain in the left knee. Onset of symptoms was gradual starting several years ago with gradually worsening course since that time. The patient noted no past surgery on the left knee. Prior procedures on the knee include . Patient has been treated conservatively with over-the-counter NSAIDs and activity modification. Patient currently rates pain in the knee at 9 out of 10 with activity. There is pain at night.  PAST MEDICAL HISTORY: There are no active problems to display for this patient.  Past Medical History  Diagnosis Date  . Hypertension   . Bronchitis   . Asthma   . Diabetes mellitus   . Stomach ulcer   . Arthritis   . Hepatitis     hepatitis C   Past Surgical History  Procedure Date  . Knee arthroscopy     left knee 2 surgeries     MEDICATIONS:   Prescriptions prior to admission  Medication Sig Dispense Refill  . gabapentin (NEURONTIN) 100 MG capsule Take 200 mg by mouth 3 (three) times daily.      . hydrochlorothiazide (HYDRODIURIL) 25 MG tablet Take 25 mg by mouth daily.      . insulin NPH-insulin regular (NOVOLIN 70/30) (70-30) 100 UNIT/ML injection Inject 35 Units into the skin 2 (two) times daily. 35 units in the morning 23 units at night      . lisinopril (PRINIVIL,ZESTRIL) 40 MG tablet Take 20 mg by mouth daily.       . traMADol (ULTRAM) 50 MG tablet Take 50 mg by mouth every 8 (eight) hours as needed. For pain      . triamcinolone cream (KENALOG) 0.1 % Apply 1 application topically 2 (two) times daily.        ALLERGIES:  No Known Allergies  REVIEW OF SYSTEMS:  Pertinent items are noted in HPI.   FAMILY HISTORY:  History reviewed. No pertinent family history.  SOCIAL HISTORY:   History  Substance Use Topics  . Smoking status: Current Everyday Smoker -- 50 years    Types: Cigarettes  . Smokeless  tobacco: Not on file   Comment: smokes only 2 cigs per day  . Alcohol Use: No     EXAMINATION:  Vital signs in last 24 hours: Temp:  [97.9 F (36.6 C)] 97.9 F (36.6 C) (08/05 0630) Pulse Rate:  [72] 72  (08/05 0630) Resp:  [18] 18  (08/05 0630) BP: (131-150)/(84-100) 131/84 mmHg (08/05 0633) SpO2:  [100 %] 100 % (08/05 0630)  General appearance: alert, cooperative and no distress Lungs: clear to auscultation bilaterally Heart: regular rate and rhythm, S1, S2 normal, no murmur, click, rub or gallop Abdomen: soft, non-tender; bowel sounds normal; no masses,  no organomegaly Extremities: extremities normal, atraumatic, no cyanosis or edema and Homans sign is negative, no sign of DVT Pulses: 2+ and symmetric Skin: Skin color, texture, turgor normal. No rashes or lesions Neurologic: Alert and oriented X 3, normal strength and tone. Normal symmetric reflexes. Normal coordination and gait  Musculoskeletal:  ROM 0-110, Ligaments intact,  Imaging Review Plain radiographs demonstrate severe degenerative joint disease of the left knee. The overall alignment is mild varus. The bone quality appears to be good for age and reported activity level.  Assessment/Plan: End stage arthritis, left knee   The patient history, physical examination and imaging studies are consistent with advanced degenerative joint  disease of the left knee. The patient has failed conservative treatment.  The clearance notes were reviewed.  After discussion with the patient it was felt that Total Knee Replacement was indicated. The procedure,  risks, and benefits of total knee arthroplasty were presented and reviewed. The risks including but not limited to aseptic loosening, infection, blood clots, vascular injury, stiffness, patella tracking problems complications among others were discussed. The patient acknowledged the explanation, agreed to proceed with the plan.  Dare Spillman 09/08/2011, 7:09 AM

## 2011-09-08 NOTE — Transfer of Care (Signed)
Immediate Anesthesia Transfer of Care Note  Patient: Timothy Lane  Procedure(s) Performed: Procedure(s) (LRB): TOTAL KNEE ARTHROPLASTY (Left)  Patient Location: PACU  Anesthesia Type: General  Level of Consciousness: awake, alert  and oriented  Airway & Oxygen Therapy: Patient Spontanous Breathing and Patient connected to nasal cannula oxygen  Post-op Assessment: Report given to PACU RN and Post -op Vital signs reviewed and stable  Post vital signs: Reviewed  Complications: No apparent anesthesia complications

## 2011-09-08 NOTE — Progress Notes (Signed)
Dressing to left knee reinforced for second time due to large amount of bloody drainage.  ABD pads and ACE wrap applied.  2 ice packs placed to knee.  Hemovac in place to suction with 250cc output since 11:15am.  Pt requesting to change IV dilaudid to morphine.  PA-C on-call paged.    Will continue to monitor drainage.

## 2011-09-08 NOTE — Anesthesia Preprocedure Evaluation (Addendum)
Anesthesia Evaluation  Patient identified by MRN, date of birth, ID band Patient awake    Reviewed: Allergy & Precautions, H&P , NPO status , Patient's Chart, lab work & pertinent test results, reviewed documented beta blocker date and time   History of Anesthesia Complications Negative for: history of anesthetic complications  Airway Mallampati: II TM Distance: >3 FB Neck ROM: Full    Dental  (+) Edentulous Upper, Dental Advisory Given and Teeth Intact   Pulmonary asthma , Current Smoker,          Cardiovascular hypertension, Pt. on medications Rhythm:regular Rate:Normal     Neuro/Psych negative neurological ROS  negative psych ROS   GI/Hepatic PUD, (+) Hepatitis -, C  Endo/Other  Well Controlled, Type 2, Insulin Dependent  Renal/GU   negative genitourinary   Musculoskeletal negative musculoskeletal ROS (+)   Abdominal   Peds negative pediatric ROS (+)  Hematology negative hematology ROS (+)   Anesthesia Other Findings   Reproductive/Obstetrics negative OB ROS                         Anesthesia Physical Anesthesia Plan  ASA: III  Anesthesia Plan: General   Post-op Pain Management:    Induction: Intravenous  Airway Management Planned: Oral ETT  Additional Equipment:   Intra-op Plan:   Post-operative Plan: Extubation in OR  Informed Consent: I have reviewed the patients History and Physical, chart, labs and discussed the procedure including the risks, benefits and alternatives for the proposed anesthesia with the patient or authorized representative who has indicated his/her understanding and acceptance.     Plan Discussed with: CRNA, Anesthesiologist and Surgeon  Anesthesia Plan Comments:         Anesthesia Quick Evaluation

## 2011-09-08 NOTE — Plan of Care (Signed)
Problem: Consults Goal: Diagnosis- Total Joint Replacement Outcome: Completed/Met Date Met:  09/08/11 Primary Total Knee  Problem: Phase I Progression Outcomes Goal: Initial discharge plan identified Outcome: Completed/Met Date Met:  09/08/11 Plan is home with wife upon discharge.

## 2011-09-08 NOTE — Anesthesia Postprocedure Evaluation (Signed)
  Anesthesia Post-op Note  Patient: Timothy Lane  Procedure(s) Performed: Procedure(s) (LRB): TOTAL KNEE ARTHROPLASTY (Left)  Patient Location: PACU  Anesthesia Type: GA combined with regional for post-op pain  Level of Consciousness: awake, alert , oriented and patient cooperative  Airway and Oxygen Therapy: Patient Spontanous Breathing and Patient connected to nasal cannula oxygen  Post-op Pain: mild  Post-op Assessment: Post-op Vital signs reviewed, Patient's Cardiovascular Status Stable, Respiratory Function Stable, Patent Airway, No signs of Nausea or vomiting and Pain level controlled  Post-op Vital Signs: stable  Complications: No apparent anesthesia complications

## 2011-09-09 ENCOUNTER — Encounter (HOSPITAL_COMMUNITY): Payer: Self-pay | Admitting: Orthopedic Surgery

## 2011-09-09 LAB — CBC
Platelets: 106 10*3/uL — ABNORMAL LOW (ref 150–400)
RDW: 13.2 % (ref 11.5–15.5)
WBC: 3.4 10*3/uL — ABNORMAL LOW (ref 4.0–10.5)

## 2011-09-09 LAB — BASIC METABOLIC PANEL
Chloride: 100 mEq/L (ref 96–112)
Creatinine, Ser: 1.02 mg/dL (ref 0.50–1.35)
GFR calc Af Amer: 90 mL/min — ABNORMAL LOW (ref >90)
GFR calc non Af Amer: 77 mL/min — ABNORMAL LOW (ref >90)
Potassium: 4.2 mEq/L (ref 3.5–5.1)

## 2011-09-09 LAB — GLUCOSE, CAPILLARY
Glucose-Capillary: 224 mg/dL — ABNORMAL HIGH (ref 70–99)
Glucose-Capillary: 313 mg/dL — ABNORMAL HIGH (ref 70–99)

## 2011-09-09 MED ORDER — FUROSEMIDE 10 MG/ML IJ SOLN
20.0000 mg | Freq: Once | INTRAMUSCULAR | Status: AC
  Administered 2011-09-09: 20 mg via INTRAVENOUS
  Filled 2011-09-09: qty 2

## 2011-09-09 MED ORDER — BETHANECHOL CHLORIDE 10 MG PO TABS
10.0000 mg | ORAL_TABLET | Freq: Three times a day (TID) | ORAL | Status: DC
  Administered 2011-09-09 – 2011-09-11 (×7): 10 mg via ORAL
  Filled 2011-09-09 (×9): qty 1

## 2011-09-09 NOTE — Progress Notes (Signed)
Timothy Mulch, MD   Carlynn Spry, PA-C [place], [place], [place]  [id number]                             [phone]   PROGRESS NOTE  Subjective:  negative for Chest Pain  negative for Shortness of Breath  negative for Nausea/Vomiting   negative for Calf Pain  negative for Bowel Movement   Tolerating Diet: yes         Patient reports pain as 4 on 0-10 scale.    Objective: Vital signs in last 24 hours:   Patient Vitals for the past 24 hrs:  BP Temp Temp src Pulse Resp SpO2 Height Weight  09/09/11 0553 140/80 mmHg 98 F (36.7 C) Oral 101  18  100 % - -  09/09/11 0400 - - - - 18  99 % - -  09/09/11 0135 110/67 mmHg 98.9 F (37.2 C) Oral 95  18  100 % - -  09/08/11 2358 - - - - 18  99 % - -  09/08/11 2200 153/87 mmHg 97.9 F (36.6 C) Oral 92  18  99 % - -  09/08/11 2000 - - - - 18  99 % - -  09/08/11 1600 - - - - 20  98 % - -  09/08/11 1500 145/82 mmHg 97.4 F (36.3 C) - 75  18  100 % - -  09/08/11 1148 - - - - - - 6\' 2"  (1.88 m) 84.823 kg (187 lb)  09/08/11 1115 149/95 mmHg 98.2 F (36.8 C) Oral 68  26  100 % - -  09/08/11 1100 - - - 69  27  100 % - -  09/08/11 1059 - - - 70  21  100 % - -  09/08/11 1058 - - - 70  24  100 % - -  09/08/11 1057 - - - 70  28  100 % - -  09/08/11 1056 - - - 70  37  100 % - -  09/08/11 1055 - - - 71  19  100 % - -  09/08/11 1054 - - - 71  25  100 % - -  09/08/11 1053 - 97.2 F (36.2 C) - 68  19  100 % - -  09/08/11 1052 - - - 71  14  100 % - -  09/08/11 1051 165/97 mmHg - - 73  27  100 % - -  09/08/11 1050 - - - 71  35  100 % - -  09/08/11 1049 - - - 68  25  100 % - -  09/08/11 1048 - - - 70  29  100 % - -  09/08/11 1047 - - - 70  40  100 % - -  09/08/11 1046 - - - 69  41  100 % - -  09/08/11 1045 - - - 73  36  100 % - -  09/08/11 1044 - - - 76  26  100 % - -  09/08/11 1043 - - - 71  33  100 % - -  09/08/11 1042 - - - 70  34  100 % - -  09/08/11 1041 - - - 70  26  100 % - -  09/08/11 1040 - - - 72  16  100 % - -    09/08/11 1039 - - - 71  16  100 % - -  09/08/11 1038 - - - 70  31  100 % - -  09/08/11 1037 - - - 70  28  100 % - -  09/08/11 1036 - - - 69  34  100 % - -  09/08/11 1035 174/94 mmHg - - 71  20  100 % - -  09/08/11 1034 - - - 71  32  100 % - -  09/08/11 1033 - - - 70  34  100 % - -  09/08/11 1032 - - - 71  42  100 % - -  09/08/11 1031 - - - 71  29  100 % - -  09/08/11 1030 - - - 71  25  100 % - -  09/08/11 1029 - - - 70  27  100 % - -  09/08/11 1028 - - - 70  29  100 % - -  09/08/11 1027 - - - 71  19  100 % - -  09/08/11 1026 - - - 71  26  100 % - -  09/08/11 1025 - - - 71  32  100 % - -  09/08/11 1024 - - - 73  19  100 % - -  09/08/11 1023 - - - 70  30  100 % - -  09/08/11 1022 - - - 71  32  100 % - -  09/08/11 1021 - - - 71  27  100 % - -  09/08/11 1020 166/102 mmHg - - 69  44  100 % - -  09/08/11 1019 - - - 70  28  100 % - -  09/08/11 1018 - - - 69  27  100 % - -  09/08/11 1017 - - - 71  18  100 % - -  09/08/11 1016 - - - 71  22  100 % - -  09/08/11 1015 - - - 70  31  100 % - -  09/08/11 1014 - - - 69  31  100 % - -  09/08/11 1013 - - - 70  33  100 % - -  09/08/11 1012 - - - 70  29  100 % - -  09/08/11 1011 - - - 69  34  100 % - -  09/08/11 1010 - - - 70  40  100 % - -  09/08/11 1009 - - - 71  22  100 % - -  09/08/11 1008 - - - 71  26  100 % - -  09/08/11 1007 173/96 mmHg - - 71  33  100 % - -  09/08/11 1006 - - - 70  37  100 % - -  09/08/11 1005 - - - 70  25  100 % - -  09/08/11 1004 - - - 70  34  100 % - -  09/08/11 1003 - - - 69  47  100 % - -  09/08/11 1002 - - - 71  38  100 % - -  09/08/11 1001 - - - 71  24  100 % - -  09/08/11 1000 - - - 73  23  100 % - -  09/08/11 0959 - - - 73  39  100 % - -  09/08/11 0958 - - - 71  37  100 % - -  09/08/11 0957 - - - 71  34  100 % - -  09/08/11 0956 - - - 70  34  100 % - -  09/08/11 0955 - - - 72  38  100 % - -  09/08/11 0954 - - - 72  39  100 % - -  09/08/11 0953 - - - 71  44  100 % - -  09/08/11 0952 - - - 71  26  100 % -  -  09/08/11 0951 - - - 72  30  100 % - -  09/08/11 0950 168/96 mmHg - - 73  23  100 % - -  09/08/11 0949 - - - 73  24  100 % - -  09/08/11 0948 - - - 73  29  100 % - -  09/08/11 0947 - - - 72  27  100 % - -  09/08/11 0946 - - - 71  29  100 % - -  09/08/11 0945 - - - 70  38  100 % - -  09/08/11 0944 - - - 71  34  100 % - -  09/08/11 0943 - - - 72  20  100 % - -  09/08/11 0942 - - - 71  22  100 % - -  09/08/11 0941 - - - 72  20  100 % - -  09/08/11 0940 - - - 75  23  100 % - -  09/08/11 0939 - - - 77  25  100 % - -  09/08/11 0938 - - - 70  41  100 % - -  09/08/11 0937 - - - 70  28  100 % - -  09/08/11 0936 - - - 69  23  100 % - -  09/08/11 0935 151/95 mmHg - - - - - - -  09/08/11 0930 - 97.7 F (36.5 C) - - - - - -    @flow {1959:LAST@   Intake/Output from previous day:   08/05 0701 - 08/06 0700 In: 2930 [P.O.:480; I.V.:1850] Out: 1790 [Urine:1300; Drains:280]   Intake/Output this shift:       Intake/Output      08/05 0701 - 08/06 0700 08/06 0701 - 08/07 0700   P.O. 480    I.V. (mL/kg) 1850 (21.8)    Other 600    Total Intake(mL/kg) 2930 (34.5)    Urine (mL/kg/hr) 1300 (0.6)    Drains 280    Other 150    Blood 60    Total Output 1790    Net +1140         Urine Occurrence 1 x       LABORATORY DATA:  Basename 09/09/11 0628 09/04/11 1630  WBC 3.4* 4.3  HGB 7.2* 11.4*  HCT 20.9* 32.6*  PLT 106* 109*    Basename 09/04/11 1630  NA 142  K 4.9  CL 104  CO2 31  BUN 27*  CREATININE 1.20  GLUCOSE 98  CALCIUM 9.7   Lab Results  Component Value Date   INR 0.95 09/04/2011    Examination:  General appearance: alert, cooperative and no distress Extremities: Homans sign is negative, no sign of DVT  Wound Exam: clean, dry, intact   Drainage:  Moderate amount Serosanguinous exudate  Motor Exam: EHL and FHL Intact  Sensory Exam: Deep Peroneal normal  Vascular Exam:    Assessment:    1 Day Post-Op  Procedure(s) (LRB): TOTAL KNEE ARTHROPLASTY  (Left)  ADDITIONAL DIAGNOSIS:  Active Problems:  * No active hospital problems. *   Acute Blood Loss Anemia   Plan: Physical Therapy as ordered  Weight Bearing as Tolerated (WBAT)  DVT Prophylaxis:  Lovenox  DISCHARGE PLAN: Home  DISCHARGE NEEDS: HHPT, CPM, Walker and 3-in-1 comode seat         [name] 09/09/2011, 7:42 AM

## 2011-09-09 NOTE — Progress Notes (Signed)
Patient unable to void post first I+O cath, bladder scanned at 2030, 416ml of urine found to be in the bladder.  Patient wanted to wait to be I+O cathed.  I+O cath at 2345 produced 542mL of urine.  Bladder scanned at 0600, 388 mL of urine found to be in the bladder.  Patient again wanted to try to urinate by himself, 153mL of urine produced by spontaneous void after bladder scan at 0600 was performed.

## 2011-09-09 NOTE — Evaluation (Signed)
Physical Therapy Evaluation Patient Details Name: Timothy Lane MRN: [id number] DOB: 1950/03/28 Today's Date: 09/09/2011 Time: [id number] PT Time Calculation (min): 24 min  PT Assessment / Plan / Recommendation Clinical Impression  Patient is 61 y/o male s/p left TKA.  Patient is eager and cooperative.  Pt participated in Ther-ex this morning but was unable to be OOB or ambulate secondary to low HGB.  Will assess ambulation further next tx session.  Pt will continued to benefit from skilled PT to improve strength, ROM and functional mobility.    PT Assessment  Patient needs continued PT services    Follow Up Recommendations       Barriers to Discharge        Equipment Recommendations  Rolling walker with 5" wheels;3 in 1 bedside comode    Recommendations for Other Services     Frequency 7X/week    Precautions / Restrictions Precautions Precautions: Knee Restrictions Weight Bearing Restrictions: Yes LLE Weight Bearing: Weight bearing as tolerated   Pertinent Vitals/Pain 3/10       Mobility  Bed Mobility Bed Mobility: Supine to Sit Supine to Sit: 5: Supervision Ambulation/Gait Ambulation/Gait Assistance: Not tested (comment) (HGB 7.1)    Exercises Total Joint Exercises Ankle Circles/Pumps: AROM;20 reps Quad Sets: AROM;Strengthening;10 reps;Left Heel Slides: AROM;Strengthening;10 reps;Left   PT Diagnosis: Difficulty walking;Abnormality of gait;Generalized weakness;Acute pain  PT Problem List: Decreased strength;Decreased range of motion;Decreased activity tolerance;Decreased mobility;Decreased cognition;Decreased safety awareness;Pain PT Treatment Interventions: DME instruction;Stair training;Gait training;Functional mobility training;Therapeutic activities;Therapeutic exercise;Patient/family education   PT Goals Acute Rehab PT Goals PT Goal Formulation: With patient Time For Goal Achievement: 09/16/11 Potential to Achieve Goals: Good Pt will go Supine/Side to Sit:  with modified independence PT Goal: Supine/Side to Sit - Progress: Goal set today Pt will go Sit to Supine/Side: with modified independence PT Goal: Sit to Supine/Side - Progress: Goal set today Pt will Stand: with modified independence PT Goal: Stand - Progress: Goal set today Pt will Ambulate: >150 feet;with modified independence;with rolling walker PT Goal: Ambulate - Progress: Goal set today Pt will Go Up / Down Stairs: 3-5 stairs;with min assist;with rolling walker PT Goal: Up/Down Stairs - Progress: Goal set today Pt will Perform Home Exercise Program: Independently PT Goal: Perform Home Exercise Program - Progress: Goal set today  Visit Information  Last PT Received On: 09/09/11 Assistance Needed: +1    Subjective Data  Subjective: "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" Patient Stated Goal: to go home   Prior [place] Lives With: Spouse Available Help at Discharge: Family Type of Home: House Home Access: Stairs to enter Technical brewer of Steps: 1 - side entrance Home Layout: One level Bathroom Shower/Tub: Chiropodist: Standard Bathroom Accessibility: Yes How Accessible: Accessible via walker Home Adaptive Equipment: Straight cane Prior Function Level of Independence: Independent;Independent with assistive device(s) Able to Take Stairs?: Yes Driving: Yes Vocation: On disability Communication Communication: No difficulties Dominant Hand: Right    Cognition  Overall Cognitive Status: Appears within functional limits for tasks assessed/performed Arousal/Alertness: Awake/alert Orientation Level: Appears intact for tasks assessed Behavior During Session: [facility] for tasks performed    Extremity/Trunk Assessment Right Upper Extremity Assessment RUE ROM/Strength/Tone: Within functional levels Left Upper Extremity Assessment LUE ROM/Strength/Tone: Within functional levels Right Lower Extremity Assessment RLE  ROM/Strength/Tone: [facility] for tasks assessed;Unable to fully assess RLE Sensation: History of peripheral neuropathy Left Lower Extremity Assessment LLE ROM/Strength/Tone: Deficits;Due to pain LLE Sensation: History of peripheral neuropathy   Balance  End of Session CPM Left Knee Left Knee Flexion (Degrees): 80  Left Knee Extension (Degrees): 5  Additional Comments: AAROM measured  GP     [name] 09/09/2011, 11:53 AM  [name], PT DPT  [phone]

## 2011-09-09 NOTE — Progress Notes (Signed)
Occupational Therapy Evaluation Patient Details Name: Timothy Lane MRN: [id number] DOB: 04-26-1950 Today's Date: 09/09/2011 Time: [id number] OT Time Calculation (min): 20 min  OT Assessment / Plan / Recommendation Clinical Impression  61 yo s/p L TKA. Limited eval this am secondary to awaiting blood transfusion. Pt will benefit from skilled Ot services to increase independence with ADL and functional mobility for ADl due to below deficits.     OT Assessment  Patient needs continued OT Services    Follow Up Recommendations  No OT follow up    Barriers to Discharge None    Equipment Recommendations  Rolling walker with 5" wheels;3 in 1 bedside comode    Recommendations for Other Services    Frequency  Min 2X/week    Precautions / Restrictions Restrictions LLE Weight Bearing: Weight bearing as tolerated   Pertinent Vitals/Pain 4    ADL  Eating/Feeding: Simulated;Independent Where Assessed - Eating/Feeding: Bed level Grooming: Simulated;Set up Where Assessed - Grooming: Supine, head of bed up Upper Body Bathing: Simulated;Set up Lower Body Bathing: Simulated;Moderate assistance Upper Body Dressing: Simulated;Set up Lower Body Dressing: Simulated;Moderate assistance ADL Comments: no knowledge of AE. Will benefit from AE for self care.    OT Diagnosis: Generalized weakness;Acute pain  OT Problem List: Decreased strength;Decreased range of motion;Decreased knowledge of use of DME or AE;Pain OT Treatment Interventions: Self-care/ADL training;Energy conservation;DME and/or AE instruction;Therapeutic activities;Patient/family education   OT Goals Acute Rehab OT Goals OT Goal Formulation: With patient Time For Goal Achievement: 09/16/11 Potential to Achieve Goals: Good ADL Goals Pt Will Perform Lower Body Bathing: with supervision;Unsupported;with adaptive equipment;Sit to stand from bed ADL Goal: Lower Body Bathing - Progress: Goal set today Pt Will Perform Lower Body  Dressing: with supervision;Unsupported;with adaptive equipment;with cueing (comment type and amount);Sit to stand from chair ADL Goal: Lower Body Dressing - Progress: Goal set today Pt Will Transfer to Toilet: with modified independence;3-in-1;Maintaining weight bearing status ADL Goal: Toilet Transfer - Progress: Goal set today Pt Will Perform Toileting - Clothing Manipulation: with modified independence;Standing ADL Goal: Toileting - Clothing Manipulation - Progress: Goal set today Pt Will Perform Toileting - Hygiene: with modified independence;Sit to stand from 3-in-1/toilet ADL Goal: Toileting - Hygiene - Progress: Goal set today Pt Will Perform Tub/Shower Transfer: with supervision;Ambulation;with DME ADL Goal: Tub/Shower Transfer - Progress: Goal set today  Visit Information  Last OT Received On: 09/09/11    Subjective Data      Prior Functioning  Vision/Perception  Home Living Lives With: Spouse Available Help at Discharge: Family Type of Home: House Home Access: Stairs to enter [employer] of Steps: 1 - side entrance Home Layout: One level Bathroom Shower/Tub: Chiropodist: Standard Bathroom Accessibility: Yes How Accessible: Accessible via walker Home Adaptive Equipment: Straight cane Prior Function Level of Independence: Independent;Independent with assistive device(s) (used cane) Able to Take Stairs?: Yes Driving: Yes Vocation: On disability Communication Communication: No difficulties Dominant Hand: Right      Cognition  Overall Cognitive Status: Appears within functional limits for tasks assessed/performed Arousal/Alertness: Awake/alert Orientation Level: Appears intact for tasks assessed Behavior During Session: [facility] for tasks performed    Extremity/Trunk Assessment Right Upper Extremity Assessment RUE ROM/Strength/Tone: Within functional levels Left Upper Extremity Assessment LUE ROM/Strength/Tone: Within functional  levels Right Lower Extremity Assessment RLE Sensation: History of peripheral neuropathy Left Lower Extremity Assessment LLE Sensation: History of peripheral neuropathy   Mobility Bed Mobility Bed Mobility: Supine to Sit Supine to Sit: 5: Supervision   Exercise  Balance    End of Session OT - End of Session Activity Tolerance: Treatment limited secondary to medical complications (Comment) (awaiting blood transfusion) Patient left: in bed;with call bell/phone within reach Nurse Communication: Other (comment) (regarding blood transfusion)  GO     Blayre Papania,HILLARY 09/09/2011, 10:41 AM Maurie Boettcher, OTR/L  [phone] 09/09/2011

## 2011-09-10 DIAGNOSIS — F17200 Nicotine dependence, unspecified, uncomplicated: Secondary | ICD-10-CM

## 2011-09-10 DIAGNOSIS — B1920 Unspecified viral hepatitis C without hepatic coma: Secondary | ICD-10-CM | POA: Diagnosis present

## 2011-09-10 DIAGNOSIS — E1142 Type 2 diabetes mellitus with diabetic polyneuropathy: Secondary | ICD-10-CM

## 2011-09-10 DIAGNOSIS — I10 Essential (primary) hypertension: Secondary | ICD-10-CM

## 2011-09-10 DIAGNOSIS — Z720 Tobacco use: Secondary | ICD-10-CM | POA: Diagnosis present

## 2011-09-10 DIAGNOSIS — E1149 Type 2 diabetes mellitus with other diabetic neurological complication: Secondary | ICD-10-CM

## 2011-09-10 DIAGNOSIS — E1140 Type 2 diabetes mellitus with diabetic neuropathy, unspecified: Secondary | ICD-10-CM | POA: Diagnosis present

## 2011-09-10 DIAGNOSIS — Z96659 Presence of unspecified artificial knee joint: Secondary | ICD-10-CM

## 2011-09-10 LAB — BASIC METABOLIC PANEL
CO2: 30 mEq/L (ref 19–32)
GFR calc non Af Amer: 60 mL/min — ABNORMAL LOW (ref >90)
Glucose, Bld: 456 mg/dL — ABNORMAL HIGH (ref 70–99)
Potassium: 4.2 mEq/L (ref 3.5–5.1)
Sodium: 135 mEq/L (ref 135–145)

## 2011-09-10 LAB — GLUCOSE, CAPILLARY
Glucose-Capillary: 436 mg/dL — ABNORMAL HIGH (ref 70–99)
Glucose-Capillary: 156 mg/dL — ABNORMAL HIGH (ref 70–99)
Glucose-Capillary: 135 mg/dL — ABNORMAL HIGH (ref 70–99)

## 2011-09-10 LAB — CBC
Hemoglobin: 8.7 g/dL — ABNORMAL LOW (ref 13.0–17.0)
Platelets: 97 10*3/uL — ABNORMAL LOW (ref 150–400)
RBC: 2.9 MIL/uL — ABNORMAL LOW (ref 4.22–5.81)

## 2011-09-10 LAB — GLUCOSE, RANDOM: Glucose, Bld: 489 mg/dL — ABNORMAL HIGH (ref 70–99)

## 2011-09-10 MED ORDER — INSULIN ASPART 100 UNIT/ML ~~LOC~~ SOLN
0.0000 [IU] | Freq: Three times a day (TID) | SUBCUTANEOUS | Status: DC
  Administered 2011-09-10: 15 [IU] via SUBCUTANEOUS
  Administered 2011-09-11: 2 [IU] via SUBCUTANEOUS
  Administered 2011-09-11: 3 [IU] via SUBCUTANEOUS

## 2011-09-10 MED ORDER — INSULIN ASPART PROT & ASPART (70-30 MIX) 100 UNIT/ML ~~LOC~~ SUSP
23.0000 [IU] | Freq: Every day | SUBCUTANEOUS | Status: DC
Start: 2011-09-11 — End: 2011-09-11
  Filled 2011-09-10: qty 3

## 2011-09-10 MED ORDER — INSULIN ASPART PROT & ASPART (70-30 MIX) 100 UNIT/ML ~~LOC~~ SUSP
35.0000 [IU] | Freq: Every day | SUBCUTANEOUS | Status: DC
  Administered 2011-09-11: 35 [IU] via SUBCUTANEOUS
  Filled 2011-09-10: qty 3

## 2011-09-10 MED ORDER — INSULIN ASPART PROT & ASPART (70-30 MIX) 100 UNIT/ML ~~LOC~~ SUSP
35.0000 [IU] | Freq: Two times a day (BID) | SUBCUTANEOUS | Status: DC
  Administered 2011-09-10: 35 [IU] via SUBCUTANEOUS
  Administered 2011-09-10: 23 [IU] via SUBCUTANEOUS
  Filled 2011-09-10: qty 3

## 2011-09-10 MED ORDER — INSULIN ASPART 100 UNIT/ML ~~LOC~~ SOLN: 0.0000 [IU] | Freq: Every day | SUBCUTANEOUS | Status: DC

## 2011-09-10 MED ORDER — INSULIN GLARGINE 100 UNIT/ML ~~LOC~~ SOLN: 23.0000 [IU] | Freq: Every day | SUBCUTANEOUS | Status: DC

## 2011-09-10 NOTE — Consult Note (Signed)
[facility] Medical Consultation  Timothy Lane [id number] DOB: [birth year] DOA: 09/08/2011 PCP: Diona Browner   Requesting physician: Carlynn Spry, Ortho PA on behalf of Timothy Lane, orthopedic surgery Date of consultation: 09/10/11 Reason for consultation: Elevated blood sugars  Impression/Recommendations Principal Problem:  *S/P total knee arthroplasty Active Problems:  Tobacco abuse  DM type 2, uncontrolled, with neuropathy  HTN (hypertension)  Hepatitis C    1. Elevated blood sugars: Patient has a history of diabetes mellitus type 2 with peripheral neuropathy. His A1c however is only around 9 and yet his CBGs have been in the 3 and 400s today. No signs of any DKA It looks as if he had surgery done on the fifth but his insulin home regimen was not restarted until this morning after sugars became markedly elevated. I change his sliding scale to moderate and likely with the institution of his normal home insulin, his sugars should come down nicely. His no signs of any additional infection including no elevated white blood cell count that would make me worry about a secondary cause. I suspect that once his sugar down, he likely can be discharged tomorrow. Continue Neurontin for his neuropathy. 2. Hepatitis C: Stable. 3. Tobacco abuse: Patient accepted nicotine patch. 4. Status post total knee arthroplasty-stable no complications. 5. Hypertension-pressures are excellent 6. Acute blood loss anemia-secondary surgery. Stable.    I will followup again tomorrow. Please contact me if I can be of assistance in the meanwhile. Thank you for this consultation.  Chief Complaint: Left knee pain  HPI:  61 year old Afro-American male past history poorly controlled diabetes and hypertension who presents with an elective total knee arthroplasty. Hospitalists were called and patient's blood sugars on postop day 2 went into the 400s.  Review of Systems:  Patient doing okay. He denies  any headaches, vision changes, dysphasia, chest pain, palpitations, shortness of breath, wheeze, cough, abdominal pain, hematuria, dysuria, constipation, diarrhea. His only complaint is of some left knee pain after his arthroplasty as well as chronic lower extremity numbness and tingling from neuropathy. His review systems otherwise negative.  Past Medical History  Diagnosis Date  . Hypertension   . Bronchitis   . Asthma   . Diabetes mellitus   . Stomach ulcer   . Arthritis   . Hepatitis     hepatitis C   Past Surgical History  Procedure Date  . Knee arthroscopy     left knee 2 surgeries  . Total knee arthroplasty 09/08/2011    Procedure: TOTAL KNEE ARTHROPLASTY;  Surgeon: Rudean Haskell, MD;  Location: [place];  Service: Orthopedics;  Laterality: Left;  left total knee arthroplasty   Social History:  reports that he has been smoking Cigarettes.  He has smoked for the past 50 years. He does not have any smokeless tobacco history on file. He reports that he uses illicit drugs (Cocaine, Marijuana, "Crack" cocaine, Heroin, LSD, and Methamphetamines). He reports that he does not drink alcohol.  No Known Allergies History reviewed. Family states diabetes runs in his family  Prior to Admission medications   Medication Sig Start Date End Date Taking? Authorizing Provider  gabapentin (NEURONTIN) 100 MG capsule Take 200 mg by mouth 3 (three) times daily.   Yes Historical Provider, MD  hydrochlorothiazide (HYDRODIURIL) 25 MG tablet Take 25 mg by mouth daily.   Yes Historical Provider, MD  insulin NPH-insulin regular (NOVOLIN 70/30) (70-30) 100 UNIT/ML injection Inject 35 Units into the skin 2 (two) times daily. 35 units in the morning  23 units at night   Yes Historical Provider, MD  lisinopril (PRINIVIL,ZESTRIL) 40 MG tablet Take 20 mg by mouth daily.    Yes Historical Provider, MD  traMADol (ULTRAM) 50 MG tablet Take 50 mg by mouth every 8 (eight) hours as needed. For pain   Yes Historical  Provider, MD  triamcinolone cream (KENALOG) 0.1 % Apply 1 application topically 2 (two) times daily.   Yes Historical Provider, MD   Physical Exam: Blood pressure 125/70, pulse 98, temperature 98.4 F (36.9 C), temperature source Oral, resp. rate 18, height 6\' 2"  (1.88 m), weight 84.823 kg (187 lb), SpO2 99.00%. Filed Vitals:   09/09/11 1932 09/09/11 2024 09/10/11 0948 09/10/11 1400  BP: 138/77 164/84 119/72 125/70  Pulse: 97 102  98  Temp: 98.6 F (37 C) 99 F (37.2 C) 98.3 F (36.8 C) 98.4 F (36.9 C)  TempSrc:      Resp: 20 20 18 18   Height:      Weight:      SpO2: 99%   99%     General:   Alert and oriented x3, no acute distress, fatigue, looks about stated age  Eyes: Sclera nonicteric, extraocular movements are intact  ENT: Normocephalic, atraumatic, mucous membranes are moist  Neck: Supple, no JVD or thyromegaly  Cardiovascular: Regular rate and rhythm, S1-S2  Respiratory: Clear to auscultation bilaterally  Abdomen: Soft, nontender, nondistended, normoactive bowel sounds  Skin: No skin tears rashes or lesions  Musculoskeletal: The wound is dry with no drainage  Psychiatric: Appropriate, no evidence of acute psychoses  Neurologic: No focal neurological deficits  Labs on Admission:  Basic Metabolic Panel:  Lab [id number] 09/10/11 0600 09/09/11 0628 09/04/11 1630  NA -- 135 133* 142  K -- 4.2 4.2 4.9  CL -- 95* 100 104  CO2 -- 30 24 31   GLUCOSE 489* 456* 292* 98  BUN -- 24* 20 27*  CREATININE -- 1.26 1.02 1.20  CALCIUM -- 8.3* 8.0* 9.7  MG -- -- -- --  PHOS -- -- -- --   Liver Function Tests:  Lab 09/04/11 1630  AST 74*  ALT 76*  ALKPHOS 91  BILITOT 0.5  PROT 7.6  ALBUMIN 3.4*   CBC:  Lab 09/10/11 0600 09/09/11 0628 09/04/11 1630  WBC 5.6 3.4* 4.3  NEUTROABS -- -- 2.1  HGB 8.7* 7.2* 11.4*  HCT 24.8* 20.9* 32.6*  MCV 85.5 87.1 88.6  PLT 97* 106* 109*   CBG:  Lab 09/10/11 1140 09/10/11 0657 09/09/11 2159 09/09/11 1604 09/09/11 1117   GLUCAP 364* 436* 313* 224* 192*     Time spent: 40 minutes  [facility] Pager [phone]  If 7PM-7AM, please contact night-coverage [email or web address] Password [email or web address] 09/10/2011, 3:40 PM

## 2011-09-10 NOTE — Progress Notes (Signed)
Pt with elevated blood glucase of 489 per lab draw.  PA notified and orders received to restart home 70/30 insulin.  35 units of 70/30 given at this time.

## 2011-09-10 NOTE — Progress Notes (Signed)
Inpatient Diabetes Program Recommendations  AACE/ADA: New Consensus Statement on Inpatient Glycemic Control (2013)  Target Ranges:  Prepandial:   less than 140 mg/dL      Peak postprandial:   less than 180 mg/dL (1-2 hours)      Critically ill patients:  140 - 180 mg/dL   Reason for Visit: Hyperglycemia  Results for Timothy Lane, Timothy Lane (MRN [id number]) as of 09/10/2011 11:33  Ref. Range 09/09/2011 11:17 09/09/2011 16:04 09/09/2011 21:59 09/10/2011 06:57  Glucose-Capillary Latest Range: 70-99 mg/dL 192 (H) 224 (H) 313 (H) 436 (H)  Results for Timothy Lane, Timothy Lane (MRN [id number]) as of 09/10/2011 11:33  Ref. Range 09/08/2011 11:26  Hemoglobin A1C Latest Range: <5.7 % 7.2 (H)    Inpatient Diabetes Program Recommendations Insulin - Basal: Agree with 70/30 35 units bid HgbA1C: good control at home - 7.2%  Note: Pt states he controls blood sugars at home, monitors, and exercises.Has had hypoglycemia in the past at home and his PCP has recently changed the 70/30 to 35 units in am and 23 units ac supper.  Received first dose of 70/30 this am.  Pt to follow up with PCP re:  Diabetes management..  Discussed with RN.

## 2011-09-10 NOTE — Progress Notes (Signed)
Physical Therapy Treatment Patient Details Name: Timothy Lane MRN: [id number] DOB: 12-09-1950 Today's Date: 09/10/2011 Time: [id number] PT Time Calculation (min): 29 min  PT Assessment / Plan / Recommendation Comments on Treatment Session  Pt initially apprehensive secondary to pain and fatigue.  Pt tolerated treatment well.  Pt required max cues for impulsivity and body control.  Pt demonstrates improvements in functional mobility. Will benefit from continued PT to improve strength, ROM and stair negotiation.    Follow Up Recommendations  Home health PT    Barriers to Discharge        Equipment Recommendations       Recommendations for Other Services    Frequency 7X/week   Plan Discharge plan remains appropriate    Precautions / Restrictions Precautions Precautions: Knee Restrictions Weight Bearing Restrictions: Yes LLE Weight Bearing: Weight bearing as tolerated   Pertinent Vitals/Pain 6/10    Mobility  Bed Mobility Bed Mobility: Supine to Sit;Sitting - Scoot to Edge of Bed Supine to Sit: 5: Supervision Sitting - Scoot to Edge of Bed: 5: Supervision Transfers Transfers: Sit to Stand;Stand to Sit Sit to Stand: 4: Min guard;With armrests;From chair/3-in-1 Stand to Sit: 4: Min guard;With armrests;To bed Details for Transfer Assistance: Vc's for hand placement and controlled movement Ambulation/Gait Ambulation/Gait Assistance: 4: Min guard Ambulation Distance (Feet): 200 Feet Assistive device: Rolling walker Gait Pattern: Step-through pattern;Decreased stride length;Antalgic Gait velocity: decreased General Gait Details: Patient impulsive and required multiple VCs for controlled movement and safety      PT Goals Acute Rehab PT Goals PT Goal Formulation: With patient Time For Goal Achievement: 09/16/11 Potential to Achieve Goals: Good Pt will go Supine/Side to Sit: with modified independence PT Goal: Supine/Side to Sit - Progress: Progressing toward goal Pt will  go Sit to Supine/Side: with modified independence PT Goal: Sit to Supine/Side - Progress: Progressing toward goal Pt will Stand: with modified independence PT Goal: Stand - Progress: Progressing toward goal Pt will Ambulate: >150 feet;with modified independence;with rolling walker PT Goal: Ambulate - Progress: Progressing toward goal  Visit Information  Last PT Received On: 09/10/11 Assistance Needed: +1    Subjective Data  Subjective: My sugars are all messed up I have no energy Patient Stated Goal: to go home   Cognition  Overall Cognitive Status: Impaired Area of Impairment: Safety/judgement Arousal/Alertness: Awake/alert Orientation Level: Appears intact for tasks assessed Behavior During Session: [name] for tasks performed Safety/Judgement: Decreased safety judgement for tasks assessed;Impulsive    Balance     End of Session PT - End of Session Equipment Utilized During Treatment: Gait belt Activity Tolerance: Patient tolerated treatment well;Patient limited by pain Patient left: in bed;in CPM;with call bell/phone within reach;with family/visitor present Nurse Communication: Mobility status;Other (comment) (nurse advised of CPM settings )   GP     [name] 09/10/2011, 2:59 PM [name], [place] DPT  [phone]

## 2011-09-10 NOTE — Progress Notes (Signed)
PHYSICAL THERAPY TREATMENT  09/09/11 1548  PT Visit Information  Last PT Received On 09/09/11  Assistance Needed +1  PT Time Calculation  PT Start Time 1448  PT Stop Time 1523  PT Time Calculation (min) 35 min  Subjective Data  Subjective I'm in a lot of pain right now  Patient Stated Goal to go home  Restrictions  Weight Bearing Restrictions Yes  LLE Weight Bearing WBAT  Cognition  Overall Cognitive Status Appears within functional limits for tasks assessed/performed  Arousal/Alertness Awake/alert  Orientation Level Appears intact for tasks assessed  Behavior During Session [facility] for tasks performed  Bed Mobility  Bed Mobility Supine to Sit;Sitting - Scoot to Edge of Bed  Supine to Sit 5: Supervision  Sitting - Scoot to [employer] of Bed 5: Supervision  Transfers  Transfers Sit to Stand;Stand to Sit  Sit to Stand 4: Min guard;With armrests;From chair/3-in-1  Stand to Sit 4: Min guard;With armrests;To bed  Details for Transfer Assistance VCs required for hand placement  Ambulation/Gait  Ambulation/Gait Assistance 4: Min guard  Ambulation Distance (Feet) 140 Feet  Assistive device Rolling walker  Gait Pattern Step-through pattern;Decreased stride length;Antalgic  Gait velocity decreased  PT - Assessment/Plan  Comments on Treatment Session Pt tolerated treatment well.  Pt able to ambbulate with minimal cues using rw.  Pt requires cues for limitations as pt displays some impulsivity with activity. pt will continue to benefit from skilled PT to improve strength, ROM, and overall functional performance.  Will address stairs and HEP in the am.  PT Plan Discharge plan remains appropriate  PT Frequency 7X/week  Follow Up Recommendations Home health PT  Acute Rehab PT Goals  PT Goal Formulation With patient  Time For Goal Achievement 09/16/11  Potential to Achieve Goals Good  Pt will go Supine/Side to Sit with modified independence  PT Goal: Supine/Side to Sit - Progress Progressing  toward goal  Pt will go Sit to Supine/Side with modified independence  PT Goal: Sit to Supine/Side - Progress Progressing toward goal  Pt will Stand with modified independence  PT Goal: Stand - Progress Progressing toward goal  Pt will Ambulate >150 feet;with modified independence;with rolling walker  PT Goal: Ambulate - Progress Progressing toward goal  PT General Charges  $$ ACUTE PT VISIT 1 Procedure  PT Treatments  $Gait Training 8-22 mins  $Therapeutic Activity 8-22 mins   [name], PT DPT  [phone]

## 2011-09-10 NOTE — Progress Notes (Signed)
PT Cancellation Note  Treatment cancelled today due to medical issues with patient which prohibited therapy and patient's refusal to participate this morning secondary to high glucose levels and patient fatigue.  [name] 09/10/2011, 2:59 PM

## 2011-09-10 NOTE — Progress Notes (Signed)
Timothy Mulch, MD   Carlynn Spry, PA-C [place], [place], [place]  [address]                             [phone]   PROGRESS NOTE  Subjective:  negative for Chest Pain  negative for Shortness of Breath  negative for Nausea/Vomiting   negative for Calf Pain  negative for Bowel Movement   Tolerating Diet: yes         Patient reports pain as 3 on 0-10 scale.    Objective: Vital signs in last 24 hours:   Patient Vitals for the past 24 hrs:  BP Temp Temp src Pulse Resp SpO2  09/10/11 0948 119/72 mmHg 98.3 F (36.8 C) - - 18  -  09/09/11 2024 164/84 mmHg 99 F (37.2 C) - 102  20  -  09/09/11 1932 138/77 mmHg 98.6 F (37 C) - 97  20  99 %  09/09/11 1845 126/74 mmHg 98.4 F (36.9 C) - 104  20  -  09/09/11 1747 131/76 mmHg 98.4 F (36.9 C) - 102  20  -  09/09/11 1710 129/71 mmHg 98.2 F (36.8 C) - 99  22  -  09/09/11 1630 125/71 mmHg 98.2 F (36.8 C) - 105  20  -  09/09/11 1458 119/72 mmHg 98.8 F (37.1 C) Oral 97  22  100 %  09/09/11 1345 125/79 mmHg 98.5 F (36.9 C) - 100  20  -  09/09/11 1245 113/71 mmHg 98.2 F (36.8 C) - 97  18  -  09/09/11 1145 107/64 mmHg 98.3 F (36.8 C) Axillary 101  20  -  09/09/11 1100 115/71 mmHg 98.8 F (37.1 C) - 97  20  -    @flow {1959:LAST@   Intake/Output from previous day:   08/06 0701 - 08/07 0700 In: 1310 [P.O.:960] Out: 300 [Urine:300]   Intake/Output this shift:       Intake/Output      08/06 0701 - 08/07 0700 08/07 0701 - 08/08 0700   P.O. 960    I.V. (mL/kg)     Blood 350    Other     Total Intake(mL/kg) 1310 (15.4)    Urine (mL/kg/hr) 300 (0.1)    Drains     Other     Blood     Total Output 300    Net +1010         Urine Occurrence 1 x       LABORATORY DATA:  Basename 09/10/11 0600 09/09/11 0628 09/04/11 1630  WBC 5.6 3.4* 4.3  HGB 8.7* 7.2* 11.4*  HCT 24.8* 20.9* 32.6*  PLT 97* 106* 109*    Basename 09/10/11 0804 09/10/11 0600 09/09/11 0628 09/04/11 1630  NA -- 135 133* 142  K --  4.2 4.2 4.9  CL -- 95* 100 104  CO2 -- 30 24 31   BUN -- 24* 20 27*  CREATININE -- 1.26 1.02 1.20  GLUCOSE 489* 456* 292* 98  CALCIUM -- 8.3* 8.0* 9.7   Lab Results  Component Value Date   INR 0.95 09/04/2011    Examination:  General appearance: alert, cooperative and no distress Extremities: Homans sign is negative, no sign of DVT  Wound Exam: clean, dry, intact   Drainage:  None: wound tissue dry  Motor Exam: EHL and FHL Intact  Sensory Exam: Deep Peroneal normal  Vascular Exam:    Assessment:    2 Days Post-Op  Procedure(s) (LRB): TOTAL KNEE ARTHROPLASTY (Left)  ADDITIONAL DIAGNOSIS:  Active Problems:  * No active hospital problems. *   Acute Blood Loss Anemia   Plan: Physical Therapy as ordered Weight Bearing as Tolerated (WBAT)  DVT Prophylaxis:  Lovenox  DISCHARGE PLAN: Home  DISCHARGE NEEDS: HHPT, CPM, Walker and 3-in-1 comode seat         Timothy Lane 09/10/2011, 10:37 AM

## 2011-09-11 DIAGNOSIS — B1920 Unspecified viral hepatitis C without hepatic coma: Secondary | ICD-10-CM

## 2011-09-11 LAB — BASIC METABOLIC PANEL
BUN: 28 mg/dL — ABNORMAL HIGH (ref 6–23)
CO2: 30 mEq/L (ref 19–32)
Calcium: 8.2 mg/dL — ABNORMAL LOW (ref 8.4–10.5)
Creatinine, Ser: 1.24 mg/dL (ref 0.50–1.35)
GFR calc non Af Amer: 61 mL/min — ABNORMAL LOW (ref >90)
Glucose, Bld: 191 mg/dL — ABNORMAL HIGH (ref 70–99)
Sodium: 136 mEq/L (ref 135–145)

## 2011-09-11 LAB — CBC
Hemoglobin: 7 g/dL — ABNORMAL LOW (ref 13.0–17.0)
MCH: 30.6 pg (ref 26.0–34.0)
MCHC: 35.5 g/dL (ref 30.0–36.0)
MCV: 86 fL (ref 78.0–100.0)
RBC: 2.29 MIL/uL — ABNORMAL LOW (ref 4.22–5.81)

## 2011-09-11 LAB — GLUCOSE, CAPILLARY: Glucose-Capillary: 133 mg/dL — ABNORMAL HIGH (ref 70–99)

## 2011-09-11 MED ORDER — CELECOXIB 200 MG PO CAPS: 200.0000 mg | ORAL_CAPSULE | Freq: Two times a day (BID) | ORAL | Status: AC

## 2011-09-11 MED ORDER — OXYCODONE HCL 5 MG PO TABS: 5.0000 mg | ORAL_TABLET | ORAL | Status: AC | PRN

## 2011-09-11 MED ORDER — ENOXAPARIN SODIUM 40 MG/0.4ML ~~LOC~~ SOLN: 40.0000 mg | Freq: Every day | SUBCUTANEOUS | Status: DC

## 2011-09-11 MED ORDER — OXYCODONE HCL 10 MG PO TB12: 10.0000 mg | ORAL_TABLET | Freq: Two times a day (BID) | ORAL | Status: DC

## 2011-09-11 MED ORDER — ONDANSETRON HCL 4 MG PO TABS: 4.0000 mg | ORAL_TABLET | Freq: Four times a day (QID) | ORAL | Status: AC | PRN

## 2011-09-11 MED ORDER — METHOCARBAMOL 500 MG PO TABS: 500.0000 mg | ORAL_TABLET | Freq: Four times a day (QID) | ORAL | Status: AC | PRN

## 2011-09-11 NOTE — Progress Notes (Signed)
PT PROGRESS NOTE   09/11/11 1500  PT Visit Information  Last PT Received On 09/11/11  Assistance Needed +1  PT Time Calculation  PT Start Time 1436  PT Stop Time 1450  PT Time Calculation (min) 14 min  Subjective Data  Subjective i'm ready to go  Patient Stated Goal to go home  Precautions  Precautions Fall;Knee  Restrictions  Weight Bearing Restrictions Yes  LLE Weight Bearing WBAT  Cognition  Overall Cognitive Status Appears within functional limits for tasks assessed/performed  Area of Impairment Safety/judgement  Arousal/Alertness Awake/alert  Orientation Level Appears intact for tasks assessed  Behavior During Session [facility] for tasks performed  Safety/Judgement Decreased awareness of safety precautions  Safety/Judgement - Other Comments pt was standing on one leg without assistive device trying to put on shirt with IV line still attached  PT - Assessment/Plan  Comments on Treatment Session Patient and spouse had inquiries regarding HEP and follow up therapy.  Addressed HEP protocol and provided additional handouts for pt and spouse.  Explained importance of safety awareness and risk modifying behaviors such as remaining seated when attempting to change clothing as well as utilizing assistive devices when appropriate.    PT Plan Discharge plan remains appropriate  PT General Charges  $$ ACUTE PT VISIT 1 Procedure  PT Treatments  $Self Care/Home Management 8-22   [name], [place] DPT  [phone]

## 2011-09-11 NOTE — Progress Notes (Signed)
Physical Therapy Treatment Patient Details Name: Timothy Lane MRN: [id number] DOB: 10-Dec-1950 Today's Date: 09/11/2011 Time: [id number] PT Time Calculation (min): 26 min  PT Assessment / Plan / Recommendation Comments on Treatment Session  Pt is making steady progress towards PT goals at this time.  Pt able to successfully negotiate stairs with rw and min assist.  Patient requires VCs for control and impulsivity.  Rec continued skilled PT to maximize independence for discharge.    Follow Up Recommendations  Home health PT       Equipment Recommendations  Rolling walker with 5" wheels;3 in 1 bedside comode;Tub/shower bench;Other (comment) ("hip kit"--reacher, sock aide, long bath sponge, long shoe h)       Frequency 7X/week   Plan Discharge plan remains appropriate    Precautions / Restrictions Precautions Precautions: Fall;Knee Restrictions Weight Bearing Restrictions: Yes LLE Weight Bearing: Weight bearing as tolerated   Pertinent Vitals/Pain 2/10    Mobility  Bed Mobility Bed Mobility: Supine to Sit;Sitting - Scoot to Edge of Bed Supine to Sit: 5: Supervision Sitting - Scoot to Edge of Bed: 5: Supervision Transfers Sit to Stand: 5: Supervision;With upper extremity assist;From chair/3-in-1 Stand to Sit: 5: Supervision;To chair/3-in-1;With upper extremity assist Details for Transfer Assistance: v.c's for technique and to slow pace.   Ambulation/Gait Ambulation/Gait Assistance: 4: Min guard Ambulation Distance (Feet): 300 Feet Assistive device: Rolling walker Gait Pattern: Step-through pattern;Decreased stride length;Antalgic General Gait Details: Patient impulsive and required multiple VCs for controlled movement and safety Stairs: Yes Stairs Assistance: 4: Min assist Stairs Assistance Details (indicate cue type and reason): Min Assiste at front of rw Stair Management Technique: Backwards;With walker;No rails Number of Stairs: 3       PT Goals Acute Rehab PT  Goals PT Goal Formulation: With patient Time For Goal Achievement: 09/16/11 Potential to Achieve Goals: Good Pt will go Supine/Side to Sit: with modified independence PT Goal: Supine/Side to Sit - Progress: Progressing toward goal Pt will go Sit to Supine/Side: with modified independence PT Goal: Sit to Supine/Side - Progress: Progressing toward goal Pt will Stand: with modified independence PT Goal: Stand - Progress: Progressing toward goal Pt will Ambulate: >150 feet;with modified independence;with rolling walker PT Goal: Ambulate - Progress: Progressing toward goal Pt will Go Up / Down Stairs: 3-5 stairs;with min assist;with rolling walker PT Goal: Up/Down Stairs - Progress: Met Pt will Perform Home Exercise Program: Independently PT Goal: Perform Home Exercise Program - Progress: Progressing toward goal  Visit Information  Last PT Received On: 09/11/11 Assistance Needed: +1    Subjective Data  Subjective: I'm not in much pain at all today Patient Stated Goal: to go home   Cognition  Overall Cognitive Status: Impaired Area of Impairment: Safety/judgement Arousal/Alertness: Awake/alert Orientation Level: Appears intact for tasks assessed Behavior During Session: [facility] for tasks performed Safety/Judgement: Decreased safety judgement for tasks assessed;Impulsive Safety/Judgement - Other Comments: pt with impulsivity       End of Session PT - End of Session Equipment Utilized During Treatment: Gait belt Activity Tolerance: Patient tolerated treatment well;Patient limited by pain Patient left: in chair;with call bell/phone within reach Nurse Communication: Mobility status CPM Left Knee CPM Left Knee: Off   GP     [name] 09/11/2011, 1:59 PM [name], [place] DPT  [phone]

## 2011-09-11 NOTE — Progress Notes (Signed)
Patient's CBG's reviewed.  Overall, he seems to be responding well with home meds resumed.  Needs to follow up with his primary doc at the [place].  Ok to be discharged.

## 2011-09-11 NOTE — Discharge Summary (Signed)
Timothy Mulch, MD   Timothy Spry, PA-C [place], [place], [place]  [address]                             [phone]  PATIENT ID: Timothy Lane        MRN:  [id number]          DOB/AGE: February 11, 1950 / 61 y.o.    DISCHARGE SUMMARY  ADMISSION DATE:    09/08/2011 DISCHARGE DATE:   09/11/2011   ADMISSION DIAGNOSIS: osteoarthritis left knee    DISCHARGE DIAGNOSIS:  osteoarthritis left knee    ADDITIONAL DIAGNOSIS: Principal Problem:  *S/P total knee arthroplasty Active Problems:  Tobacco abuse  DM type 2, uncontrolled, with neuropathy  HTN (hypertension)  Hepatitis C  Past Medical History  Diagnosis Date  . Hypertension   . Bronchitis   . Asthma   . Diabetes mellitus   . Stomach ulcer   . Arthritis   . Hepatitis     hepatitis C    PROCEDURE: Procedure(s): TOTAL KNEE ARTHROPLASTY on 09/08/2011  CONSULTS: Treatment Team:  Timothy Brod, MD   HISTORY:  See H&P in chart  HOSPITAL COURSE:  Timothy Lane is a 61 y.o. admitted on 09/08/2011 and found to have a diagnosis of osteoarthritis left knee.  After appropriate laboratory studies were obtained  they were taken to the operating room on 09/08/2011 and underwent Procedure(s): TOTAL KNEE ARTHROPLASTY.   They were given perioperative antibiotics:  Anti-infectives     Start     Dose/Rate Route Frequency Ordered Stop   09/07/11 1336   ceFAZolin (ANCEF) IVPB 2 g/50 mL premix        2 g 100 mL/hr over 30 Minutes Intravenous 60 min pre-op 09/07/11 1336 09/08/11 0732        .  Tolerated the procedure well.  Placed with a foley intraoperatively.  Given Ofirmev at induction and for 48 hours.    POD #1, allowed out of bed to a chair.  PT for ambulation and exercise program.  Foley D/C'd in morning.  IV saline locked.  O2 discontionued.  POD #2, continued PT and ambulation.   Hemovac pulled. glucose spiked to 489, Medicine was consulted to control.   The remainder of the hospital course was dedicated to  ambulation and strengthening.   The patient was discharged on 3 Days Post-Op in  Good condition.  Blood products given:3 units of prbc, patient came in with existing anemia  DIAGNOSTIC STUDIES: Recent vital signs: Patient Vitals for the past 24 hrs:  BP Temp Temp src Pulse Resp SpO2  09/11/11 0928 120/65 mmHg - - - - -  09/11/11 0630 107/90 mmHg 98.6 F (37 C) Oral 95  18  97 %  2011-10-02 2003 116/70 mmHg 98 F (36.7 C) - 98  - 99 %  Oct 02, 2011 1400 125/70 mmHg 98.4 F (36.9 C) - 98  18  99 %       Recent laboratory studies:  Basename 09/11/11 0611 Oct 02, 2011 0600 09/09/11 0628 09/04/11 1630  WBC 4.4 5.6 3.4* 4.3  HGB 7.0* 8.7* 7.2* 11.4*  HCT 19.7* 24.8* 20.9* 32.6*  PLT 84* 97* 106* 109*    Basename 09/11/11 0611 10-02-11 0804 10/02/2011 0600 09/09/11 0628 09/04/11 1630  NA 136 -- 135 133* 142  K 4.1 -- 4.2 4.2 4.9  CL 99 -- 95* 100 104  CO2 30 -- 30 24 31   BUN  28* -- 24* 20 27*  CREATININE 1.24 -- 1.26 1.02 1.20  GLUCOSE 191* 489* 456* 292* 98  CALCIUM 8.2* -- 8.3* 8.0* 9.7   Lab Results  Component Value Date   INR 0.95 09/04/2011     Recent Radiographic Studies :  Dg Chest 2 View  09/04/2011  *RADIOLOGY REPORT*  Clinical Data: Preoperative evaluation for knee replacement, hypertension, diabetes, asthma, smoker  CHEST - 2 VIEW  Comparison: 01/09/2004  Findings: Left lower chest nodular density noted, suspicious for nipple shadow.  This could be confirmed with repeat exam including markers.  Normal heart size.  Negative for CHF, pneumonia, collapse, consolidation, edema, effusion, pneumothorax.  Trachea midline.  IMPRESSION: Probable left nipple shadow,  can be confirmed with repeat exam including markers.  No acute chest process.  Original Report Authenticated By: Timothy Lane, M.D.    DISCHARGE INSTRUCTIONS: Discharge Orders    Future Appointments: Provider: Department: Dept Phone: Center:   09/12/2011 9:45 AM Timothy Lane, PT [facility] [phone] [facility]     Future Orders  Please Complete By Expires   Diet - low sodium heart healthy      Call MD / Call 911      Comments:   If you experience chest pain or shortness of breath, CALL 911 and be transported to the hospital emergency room.  If you develope a fever above 101 F, pus (white drainage) or increased drainage or redness at the wound, or calf pain, call your surgeon's office.   Constipation Prevention      Comments:   Drink plenty of fluids.  Prune juice may be helpful.  You may use a stool softener, such as Colace (over the counter) 100 mg twice a day.  Use MiraLax (over the counter) for constipation as needed.   Increase activity slowly as tolerated      Driving restrictions      Comments:   No driving for 6 weeks   Lifting restrictions      Comments:   No lifting for 6 weeks   CPM      Comments:   Continuous passive motion machine (CPM):      Use the CPM from 0 to 90 for 6-8 hours per day.      You may increase by 10 per day.  You may break it up into 2 or 3 sessions per day.      Use CPM for 3 weeks or until you are told to stop.   TED hose      Comments:   Use stockings (TED hose) for 3 weeks on both leg(s).  You may remove them at night for sleeping.   Change dressing      Comments:   Change dressing on friday, then change the dressing daily with sterile 4 x 4 inch gauze dressing and apply TED hose.  You may clean the incision with alcohol prior to redressing.      DISCHARGE MEDICATIONS:   Medication List  As of 09/11/2011 11:25 AM   STOP taking these medications         traMADol 50 MG tablet         TAKE these medications         celecoxib 200 MG capsule   Commonly known as: CELEBREX   Take 1 capsule (200 mg total) by mouth every 12 (twelve) hours.      enoxaparin 40 MG/0.4ML injection   Commonly known as: LOVENOX   Inject 0.4 mLs (40  mg total) into the skin daily.      gabapentin 100 MG capsule   Commonly known as: NEURONTIN   Take 200 mg by mouth 3 (three) times daily.       hydrochlorothiazide 25 MG tablet   Commonly known as: HYDRODIURIL   Take 25 mg by mouth daily.      insulin NPH-insulin regular (70-30) 100 UNIT/ML injection   Commonly known as: NOVOLIN 70/30   Inject 35 Units into the skin 2 (two) times daily. 35 units in the morning  23 units at night      lisinopril 40 MG tablet   Commonly known as: PRINIVIL,ZESTRIL   Take 20 mg by mouth daily.      methocarbamol 500 MG tablet   Commonly known as: ROBAXIN   Take 1 tablet (500 mg total) by mouth every 6 (six) hours as needed.      ondansetron 4 MG tablet   Commonly known as: ZOFRAN   Take 1 tablet (4 mg total) by mouth every 6 (six) hours as needed for nausea.      oxyCODONE 5 MG immediate release tablet   Commonly known as: Oxy IR/ROXICODONE   Take 1-2 tablets (5-10 mg total) by mouth every 3 (three) hours as needed.      oxyCODONE 10 MG 12 hr tablet   Commonly known as: OXYCONTIN   Take 1 tablet (10 mg total) by mouth every 12 (twelve) hours.      triamcinolone cream 0.1 %   Commonly known as: KENALOG   Apply 1 application topically 2 (two) times daily.            FOLLOW UP VISIT:   Follow-up Information    Follow up with Rudean Haskell, MD. Call on 09/23/2011. (physical therapy start friday august 9th at [facility] on [address] )    Contact information:   [place] [phone]          DISPOSITION:  Home    CONDITION:  Good   Supreme Rybarczyk 09/11/2011, 11:25 AM

## 2011-09-11 NOTE — Progress Notes (Signed)
Occupational Therapy Treatment Patient Details Name: Timothy Lane MRN: [id number] DOB: February 02, 1951 Today's Date: 09/11/2011 Time: [id number] OT Time Calculation (min): 14 min  OT Assessment / Plan / Recommendation Comments on Treatment Session Pt is progressing steadily in mobility for ADL.  He is knowledgeable in use of AE.  Not able to tolerate shower transfer practice due to dizziness and nausea.    Follow Up Recommendations  No OT follow up    Barriers to Discharge       Equipment Recommendations  Rolling walker with 5" wheels;3 in 1 bedside comode;Tub/shower bench;Other (comment) ("hip kit"--reacher, sock aide, long bath sponge, long shoe horn)    Recommendations for Other Services    Frequency     Plan Discharge plan remains appropriate    Precautions / Restrictions Precautions Precautions: Fall;Knee Restrictions Weight Bearing Restrictions: Yes LLE Weight Bearing: Weight bearing as tolerated   Pertinent Vitals/Pain No c/o of pain    ADL  Lower Body Bathing: Simulated;Supervision/safety Where Assessed - Lower Body Bathing: Supported sit to stand Lower Body Dressing: Performed;Supervision/safety Where Assessed - Lower Body Dressing: Supported sit to Lobbyist: Building services engineer Method: Sit to stand Equipment Used: Sock aid;Rolling walker;Reacher;Long-handled sponge;Long-handled shoe horn;Gait belt Transfers/Ambulation Related to ADLs: Pt requiring supervision and RW for ambulation, ADL Comments: Pt practiced use of sock aide and reacher for LB ADL.  Reviewed use of long shoe horn and long bath sponge.  Will need all for home.  Pt with dizziness and nausea, HgB 7.0 so deferred tub transfer.  RN made aware. Pt planning to wear flip flops at home.  Cautioned pt against this choice as he could fall as he backs up with the walker.    OT Diagnosis:    OT Problem List:   OT Treatment Interventions:     OT Goals Acute Rehab OT Goals OT  Goal Formulation: With patient ADL Goals Pt Will Perform Lower Body Bathing: with supervision;Unsupported;with adaptive equipment;Sit to stand from bed ADL Goal: Lower Body Bathing - Progress: Met Pt Will Perform Lower Body Dressing: with supervision;Unsupported;with adaptive equipment;with cueing (comment type and amount);Sit to stand from chair ADL Goal: Lower Body Dressing - Progress: Met Pt Will Transfer to Toilet: with modified independence;3-in-1;Maintaining weight bearing status ADL Goal: Toilet Transfer - Progress: Progressing toward goals  Visit Information  Last OT Received On: 09/11/11 Assistance Needed: +1    Subjective Data      Prior Functioning       Cognition  Overall Cognitive Status: Impaired Area of Impairment: Safety/judgement Arousal/Alertness: Awake/alert Orientation Level: Appears intact for tasks assessed Behavior During Session: [facility] for tasks performed Safety/Judgement: Decreased safety judgement for tasks assessed;Impulsive Safety/Judgement - Other Comments: pt with impulsivity    Mobility Transfers Transfers: Sit to Stand Sit to Stand: 5: Supervision;With upper extremity assist;From chair/3-in-1 Stand to Sit: 5: Supervision;To chair/3-in-1;With upper extremity assist Details for Transfer Assistance: v.c's for technique and to slow pace.     Exercises    Balance    End of Session OT - End of Session Activity Tolerance: Treatment limited secondary to medical complications (Comment) (pt with dizziness and nausea) Patient left: in chair;with call bell/phone within reach Nurse Communication: Other (comment) (pt with nausea and dizziness)   GO     Malka So 09/11/2011, 11:29 AM [phone]

## 2011-09-11 NOTE — Progress Notes (Signed)
  Timothy Mulch, MD   Carlynn Spry, PA-C [address], [place], [place]  [address]                             [phone]   PROGRESS NOTE  Subjective:  negative for Chest Pain  negative for Shortness of Breath  negative for Nausea/Vomiting   negative for Calf Pain  negative for Bowel Movement   Tolerating Diet: yes         Patient reports pain as 4 on 0-10 scale.    Objective: Vital signs in last 24 hours:   Patient Vitals for the past 24 hrs:  BP Temp Temp src Pulse Resp SpO2  09/11/11 0928 120/65 mmHg - - - - -  09/11/11 0630 107/90 mmHg 98.6 F (37 C) Oral 95  18  97 %  09/10/11 2003 116/70 mmHg 98 F (36.7 C) - 98  - 99 %  09/10/11 1400 125/70 mmHg 98.4 F (36.9 C) - 98  18  99 %    @flow {1959:LAST@   Intake/Output from previous day:   08/07 0701 - 08/08 0700 In: 480 [P.O.:480] Out: 1250 [Urine:1250]   Intake/Output this shift:   08/08 0701 - 08/08 1900 In: 480 [P.O.:480] Out: 350 [Urine:350]   Intake/Output      08/07 0701 - 08/08 0700 08/08 0701 - 08/09 0700   P.O. 480 480   Blood     Total Intake(mL/kg) 480 (5.7) 480 (5.7)   Urine (mL/kg/hr) 1250 (0.6) 350 (1)   Total Output 1250 350   Net -770 +130           LABORATORY DATA:  Basename 09/11/11 0611 09/10/11 0600 09/09/11 0628 09/04/11 1630  WBC 4.4 5.6 3.4* 4.3  HGB 7.0* 8.7* 7.2* 11.4*  HCT 19.7* 24.8* 20.9* 32.6*  PLT 84* 97* 106* 109*    Basename 09/11/11 0611 09/10/11 0804 09/10/11 0600 09/09/11 0628 09/04/11 1630  NA 136 -- 135 133* 142  K 4.1 -- 4.2 4.2 4.9  CL 99 -- 95* 100 104  CO2 30 -- 30 24 31   BUN 28* -- 24* 20 27*  CREATININE 1.24 -- 1.26 1.02 1.20  GLUCOSE 191* 489* 456* 292* 98  CALCIUM 8.2* -- 8.3* 8.0* 9.7   Lab Results  Component Value Date   INR 0.95 09/04/2011    Examination:  General appearance: alert, cooperative and no distress Extremities: Homans sign is negative, no sign of DVT  Wound Exam: clean, dry, intact   Drainage:  None: wound tissue  dry  Motor Exam: EHL and FHL Intact  Sensory Exam: Deep Peroneal normal  Vascular Exam:    Assessment:    3 Days Post-Op  Procedure(s) (LRB): TOTAL KNEE ARTHROPLASTY (Left)  ADDITIONAL DIAGNOSIS:  Principal Problem:  *S/P total knee arthroplasty Active Problems:  Tobacco abuse  DM type 2, uncontrolled, with neuropathy  HTN (hypertension)  Hepatitis C  Acute Blood Loss Anemia   Plan: Physical Therapy as ordered Weight Bearing as Tolerated (WBAT)  DVT Prophylaxis:  Lovenox  DISCHARGE PLAN: Home  DISCHARGE NEEDS: HHPT, CPM, Walker and 3-in-1 comode seat         Jahzara Slattery 09/11/2011, 11:05 AM

## 2011-09-12 ENCOUNTER — Ambulatory Visit: Attending: Orthopedic Surgery | Admitting: Physical Therapy

## 2011-09-12 DIAGNOSIS — M1710 Unilateral primary osteoarthritis, unspecified knee: Secondary | ICD-10-CM | POA: Insufficient documentation

## 2011-09-12 DIAGNOSIS — Z96659 Presence of unspecified artificial knee joint: Secondary | ICD-10-CM | POA: Insufficient documentation

## 2011-09-12 DIAGNOSIS — R262 Difficulty in walking, not elsewhere classified: Secondary | ICD-10-CM | POA: Insufficient documentation

## 2011-09-12 DIAGNOSIS — IMO0001 Reserved for inherently not codable concepts without codable children: Secondary | ICD-10-CM | POA: Insufficient documentation

## 2011-09-12 DIAGNOSIS — R5381 Other malaise: Secondary | ICD-10-CM | POA: Insufficient documentation

## 2011-09-12 DIAGNOSIS — M25569 Pain in unspecified knee: Secondary | ICD-10-CM | POA: Insufficient documentation

## 2011-09-12 DIAGNOSIS — M25669 Stiffness of unspecified knee, not elsewhere classified: Secondary | ICD-10-CM | POA: Insufficient documentation

## 2011-09-12 LAB — TYPE AND SCREEN
ABO/RH(D): O POS
Antibody Screen: NEGATIVE
Unit division: 0

## 2011-09-15 ENCOUNTER — Ambulatory Visit: Admitting: Physical Therapy

## 2011-09-17 ENCOUNTER — Ambulatory Visit: Admitting: Physical Therapy

## 2011-09-18 ENCOUNTER — Ambulatory Visit: Admitting: Rehabilitative and Restorative Service Providers"

## 2011-09-23 ENCOUNTER — Ambulatory Visit: Admitting: Physical Therapy

## 2011-09-24 ENCOUNTER — Ambulatory Visit: Admitting: Physical Therapy

## 2011-09-25 ENCOUNTER — Ambulatory Visit: Admitting: Physical Therapy

## 2011-09-29 ENCOUNTER — Ambulatory Visit: Admitting: Physical Therapy

## 2011-10-01 ENCOUNTER — Ambulatory Visit: Admitting: Physical Therapy

## 2011-10-02 ENCOUNTER — Ambulatory Visit: Attending: Internal Medicine | Admitting: Rehabilitation

## 2011-10-02 DIAGNOSIS — M25669 Stiffness of unspecified knee, not elsewhere classified: Secondary | ICD-10-CM | POA: Insufficient documentation

## 2011-10-02 DIAGNOSIS — IMO0001 Reserved for inherently not codable concepts without codable children: Secondary | ICD-10-CM | POA: Insufficient documentation

## 2011-10-02 DIAGNOSIS — M1710 Unilateral primary osteoarthritis, unspecified knee: Secondary | ICD-10-CM | POA: Insufficient documentation

## 2011-10-02 DIAGNOSIS — Z96659 Presence of unspecified artificial knee joint: Secondary | ICD-10-CM | POA: Insufficient documentation

## 2011-10-02 DIAGNOSIS — R5381 Other malaise: Secondary | ICD-10-CM | POA: Insufficient documentation

## 2011-10-02 DIAGNOSIS — M25569 Pain in unspecified knee: Secondary | ICD-10-CM | POA: Insufficient documentation

## 2011-10-02 DIAGNOSIS — R262 Difficulty in walking, not elsewhere classified: Secondary | ICD-10-CM | POA: Insufficient documentation

## 2011-10-07 ENCOUNTER — Ambulatory Visit: Attending: Internal Medicine | Admitting: Rehabilitation

## 2011-10-07 DIAGNOSIS — R5381 Other malaise: Secondary | ICD-10-CM | POA: Insufficient documentation

## 2011-10-07 DIAGNOSIS — R262 Difficulty in walking, not elsewhere classified: Secondary | ICD-10-CM | POA: Insufficient documentation

## 2011-10-07 DIAGNOSIS — IMO0001 Reserved for inherently not codable concepts without codable children: Secondary | ICD-10-CM | POA: Insufficient documentation

## 2011-10-07 DIAGNOSIS — M25669 Stiffness of unspecified knee, not elsewhere classified: Secondary | ICD-10-CM | POA: Insufficient documentation

## 2011-10-07 DIAGNOSIS — M25569 Pain in unspecified knee: Secondary | ICD-10-CM | POA: Insufficient documentation

## 2011-10-08 ENCOUNTER — Ambulatory Visit: Attending: Orthopedic Surgery | Admitting: Rehabilitation

## 2011-10-08 DIAGNOSIS — M25669 Stiffness of unspecified knee, not elsewhere classified: Secondary | ICD-10-CM | POA: Insufficient documentation

## 2011-10-08 DIAGNOSIS — M25569 Pain in unspecified knee: Secondary | ICD-10-CM | POA: Insufficient documentation

## 2011-10-08 DIAGNOSIS — R262 Difficulty in walking, not elsewhere classified: Secondary | ICD-10-CM | POA: Insufficient documentation

## 2011-10-08 DIAGNOSIS — R5381 Other malaise: Secondary | ICD-10-CM | POA: Insufficient documentation

## 2011-10-08 DIAGNOSIS — IMO0001 Reserved for inherently not codable concepts without codable children: Secondary | ICD-10-CM | POA: Insufficient documentation

## 2011-10-09 ENCOUNTER — Ambulatory Visit: Admitting: Rehabilitation

## 2011-10-15 ENCOUNTER — Ambulatory Visit: Attending: Orthopedic Surgery | Admitting: Physical Therapy

## 2011-10-17 ENCOUNTER — Ambulatory Visit: Attending: Orthopedic Surgery | Admitting: Rehabilitation

## 2011-10-17 DIAGNOSIS — IMO0001 Reserved for inherently not codable concepts without codable children: Secondary | ICD-10-CM | POA: Insufficient documentation

## 2011-10-17 DIAGNOSIS — M25569 Pain in unspecified knee: Secondary | ICD-10-CM | POA: Insufficient documentation

## 2011-10-17 DIAGNOSIS — R262 Difficulty in walking, not elsewhere classified: Secondary | ICD-10-CM | POA: Insufficient documentation

## 2011-10-17 DIAGNOSIS — M25669 Stiffness of unspecified knee, not elsewhere classified: Secondary | ICD-10-CM | POA: Insufficient documentation

## 2011-10-17 DIAGNOSIS — Z96659 Presence of unspecified artificial knee joint: Secondary | ICD-10-CM | POA: Insufficient documentation

## 2011-10-17 DIAGNOSIS — R5381 Other malaise: Secondary | ICD-10-CM | POA: Insufficient documentation

## 2011-10-17 DIAGNOSIS — M1710 Unilateral primary osteoarthritis, unspecified knee: Secondary | ICD-10-CM | POA: Insufficient documentation

## 2011-10-20 ENCOUNTER — Encounter: Payer: Self-pay | Admitting: Physical Therapy

## 2011-10-22 ENCOUNTER — Ambulatory Visit: Admitting: Rehabilitation

## 2011-10-24 ENCOUNTER — Ambulatory Visit: Admitting: Physical Therapy

## 2011-10-27 ENCOUNTER — Ambulatory Visit: Admitting: Rehabilitation

## 2011-10-29 ENCOUNTER — Ambulatory Visit: Attending: Internal Medicine | Admitting: Rehabilitation

## 2011-10-29 DIAGNOSIS — M25569 Pain in unspecified knee: Secondary | ICD-10-CM | POA: Insufficient documentation

## 2011-10-29 DIAGNOSIS — M1710 Unilateral primary osteoarthritis, unspecified knee: Secondary | ICD-10-CM | POA: Insufficient documentation

## 2011-10-29 DIAGNOSIS — R262 Difficulty in walking, not elsewhere classified: Secondary | ICD-10-CM | POA: Insufficient documentation

## 2011-10-29 DIAGNOSIS — M25669 Stiffness of unspecified knee, not elsewhere classified: Secondary | ICD-10-CM | POA: Insufficient documentation

## 2011-10-29 DIAGNOSIS — R5381 Other malaise: Secondary | ICD-10-CM | POA: Insufficient documentation

## 2011-10-29 DIAGNOSIS — IMO0001 Reserved for inherently not codable concepts without codable children: Secondary | ICD-10-CM | POA: Insufficient documentation

## 2011-10-31 ENCOUNTER — Ambulatory Visit: Admitting: Rehabilitation

## 2011-11-03 ENCOUNTER — Ambulatory Visit: Admitting: Rehabilitation

## 2011-11-05 ENCOUNTER — Encounter: Payer: Self-pay | Admitting: Rehabilitation

## 2011-11-06 ENCOUNTER — Ambulatory Visit: Attending: Orthopedic Surgery | Admitting: Physical Therapy

## 2011-11-06 DIAGNOSIS — IMO0001 Reserved for inherently not codable concepts without codable children: Secondary | ICD-10-CM | POA: Insufficient documentation

## 2011-11-06 DIAGNOSIS — R262 Difficulty in walking, not elsewhere classified: Secondary | ICD-10-CM | POA: Insufficient documentation

## 2011-11-06 DIAGNOSIS — R5381 Other malaise: Secondary | ICD-10-CM | POA: Insufficient documentation

## 2011-11-06 DIAGNOSIS — M25569 Pain in unspecified knee: Secondary | ICD-10-CM | POA: Insufficient documentation

## 2011-11-06 DIAGNOSIS — Z96659 Presence of unspecified artificial knee joint: Secondary | ICD-10-CM | POA: Insufficient documentation

## 2011-11-06 DIAGNOSIS — M1710 Unilateral primary osteoarthritis, unspecified knee: Secondary | ICD-10-CM | POA: Insufficient documentation

## 2011-11-06 DIAGNOSIS — M25669 Stiffness of unspecified knee, not elsewhere classified: Secondary | ICD-10-CM | POA: Insufficient documentation

## 2011-11-07 ENCOUNTER — Encounter: Payer: Self-pay | Admitting: Rehabilitation

## 2011-11-10 ENCOUNTER — Ambulatory Visit: Attending: Orthopedic Surgery | Admitting: Rehabilitation

## 2011-11-10 DIAGNOSIS — M25669 Stiffness of unspecified knee, not elsewhere classified: Secondary | ICD-10-CM | POA: Insufficient documentation

## 2011-11-10 DIAGNOSIS — Z96659 Presence of unspecified artificial knee joint: Secondary | ICD-10-CM | POA: Insufficient documentation

## 2011-11-10 DIAGNOSIS — R5381 Other malaise: Secondary | ICD-10-CM | POA: Insufficient documentation

## 2011-11-10 DIAGNOSIS — IMO0001 Reserved for inherently not codable concepts without codable children: Secondary | ICD-10-CM | POA: Insufficient documentation

## 2011-11-10 DIAGNOSIS — R262 Difficulty in walking, not elsewhere classified: Secondary | ICD-10-CM | POA: Insufficient documentation

## 2011-11-12 ENCOUNTER — Encounter: Payer: Self-pay | Admitting: Rehabilitation

## 2011-11-13 ENCOUNTER — Encounter: Payer: Self-pay | Admitting: Physical Therapy

## 2011-11-17 ENCOUNTER — Ambulatory Visit: Attending: Orthopedic Surgery | Admitting: Physical Therapy

## 2011-11-17 DIAGNOSIS — R262 Difficulty in walking, not elsewhere classified: Secondary | ICD-10-CM | POA: Insufficient documentation

## 2011-11-17 DIAGNOSIS — Z96659 Presence of unspecified artificial knee joint: Secondary | ICD-10-CM | POA: Insufficient documentation

## 2011-11-17 DIAGNOSIS — M1710 Unilateral primary osteoarthritis, unspecified knee: Secondary | ICD-10-CM | POA: Insufficient documentation

## 2011-11-17 DIAGNOSIS — M25669 Stiffness of unspecified knee, not elsewhere classified: Secondary | ICD-10-CM | POA: Insufficient documentation

## 2011-11-17 DIAGNOSIS — R5381 Other malaise: Secondary | ICD-10-CM | POA: Insufficient documentation

## 2011-11-17 DIAGNOSIS — IMO0001 Reserved for inherently not codable concepts without codable children: Secondary | ICD-10-CM | POA: Insufficient documentation

## 2011-11-17 DIAGNOSIS — M25569 Pain in unspecified knee: Secondary | ICD-10-CM | POA: Insufficient documentation

## 2011-11-20 ENCOUNTER — Ambulatory Visit: Attending: Internal Medicine | Admitting: Rehabilitation

## 2011-11-20 DIAGNOSIS — M25669 Stiffness of unspecified knee, not elsewhere classified: Secondary | ICD-10-CM | POA: Insufficient documentation

## 2011-11-20 DIAGNOSIS — IMO0001 Reserved for inherently not codable concepts without codable children: Secondary | ICD-10-CM | POA: Insufficient documentation

## 2011-11-20 DIAGNOSIS — R5381 Other malaise: Secondary | ICD-10-CM | POA: Insufficient documentation

## 2011-11-20 DIAGNOSIS — M25569 Pain in unspecified knee: Secondary | ICD-10-CM | POA: Insufficient documentation

## 2011-11-20 DIAGNOSIS — R262 Difficulty in walking, not elsewhere classified: Secondary | ICD-10-CM | POA: Insufficient documentation

## 2011-11-20 DIAGNOSIS — Z96659 Presence of unspecified artificial knee joint: Secondary | ICD-10-CM | POA: Insufficient documentation

## 2011-11-20 DIAGNOSIS — M1710 Unilateral primary osteoarthritis, unspecified knee: Secondary | ICD-10-CM | POA: Insufficient documentation

## 2011-11-24 ENCOUNTER — Encounter: Payer: Self-pay | Admitting: Physical Therapy

## 2011-11-27 ENCOUNTER — Ambulatory Visit: Attending: Internal Medicine | Admitting: Physical Therapy

## 2011-12-01 ENCOUNTER — Ambulatory Visit: Attending: Internal Medicine | Admitting: Physical Therapy

## 2011-12-01 DIAGNOSIS — R262 Difficulty in walking, not elsewhere classified: Secondary | ICD-10-CM | POA: Insufficient documentation

## 2011-12-01 DIAGNOSIS — R5381 Other malaise: Secondary | ICD-10-CM | POA: Insufficient documentation

## 2011-12-01 DIAGNOSIS — M1710 Unilateral primary osteoarthritis, unspecified knee: Secondary | ICD-10-CM | POA: Insufficient documentation

## 2011-12-01 DIAGNOSIS — M25569 Pain in unspecified knee: Secondary | ICD-10-CM | POA: Insufficient documentation

## 2011-12-01 DIAGNOSIS — Z96659 Presence of unspecified artificial knee joint: Secondary | ICD-10-CM | POA: Insufficient documentation

## 2011-12-01 DIAGNOSIS — M25669 Stiffness of unspecified knee, not elsewhere classified: Secondary | ICD-10-CM | POA: Insufficient documentation

## 2011-12-01 DIAGNOSIS — IMO0001 Reserved for inherently not codable concepts without codable children: Secondary | ICD-10-CM | POA: Insufficient documentation

## 2011-12-04 ENCOUNTER — Ambulatory Visit: Admitting: Physical Therapy

## 2011-12-08 ENCOUNTER — Ambulatory Visit: Admitting: Physical Therapy

## 2011-12-11 ENCOUNTER — Encounter: Payer: Self-pay | Admitting: Physical Therapy

## 2012-01-29 ENCOUNTER — Ambulatory Visit: Admitting: Physical Therapy

## 2012-02-10 ENCOUNTER — Ambulatory Visit: Attending: Orthopedic Surgery | Admitting: Physical Therapy

## 2012-02-10 DIAGNOSIS — M1710 Unilateral primary osteoarthritis, unspecified knee: Secondary | ICD-10-CM | POA: Insufficient documentation

## 2012-02-10 DIAGNOSIS — IMO0001 Reserved for inherently not codable concepts without codable children: Secondary | ICD-10-CM | POA: Insufficient documentation

## 2012-02-10 DIAGNOSIS — Z96659 Presence of unspecified artificial knee joint: Secondary | ICD-10-CM | POA: Insufficient documentation

## 2012-02-10 DIAGNOSIS — M25669 Stiffness of unspecified knee, not elsewhere classified: Secondary | ICD-10-CM | POA: Insufficient documentation

## 2012-02-10 DIAGNOSIS — R5381 Other malaise: Secondary | ICD-10-CM | POA: Insufficient documentation

## 2012-02-10 DIAGNOSIS — R262 Difficulty in walking, not elsewhere classified: Secondary | ICD-10-CM | POA: Insufficient documentation

## 2012-02-10 DIAGNOSIS — M25569 Pain in unspecified knee: Secondary | ICD-10-CM | POA: Insufficient documentation

## 2012-02-16 ENCOUNTER — Ambulatory Visit: Admitting: Rehabilitative and Restorative Service Providers"

## 2012-02-18 ENCOUNTER — Ambulatory Visit: Admitting: Rehabilitative and Restorative Service Providers"

## 2012-02-23 ENCOUNTER — Ambulatory Visit: Admitting: Rehabilitative and Restorative Service Providers"

## 2012-02-25 ENCOUNTER — Encounter: Payer: Self-pay | Admitting: Rehabilitative and Restorative Service Providers"

## 2012-02-25 ENCOUNTER — Ambulatory Visit: Admitting: Rehabilitative and Restorative Service Providers"

## 2012-03-01 ENCOUNTER — Ambulatory Visit: Admitting: Rehabilitative and Restorative Service Providers"

## 2012-03-03 ENCOUNTER — Encounter: Payer: Self-pay | Admitting: Rehabilitative and Restorative Service Providers"

## 2012-03-04 ENCOUNTER — Encounter: Payer: Self-pay | Admitting: Rehabilitative and Restorative Service Providers"

## 2012-03-08 ENCOUNTER — Encounter: Payer: Self-pay | Admitting: Rehabilitative and Restorative Service Providers"

## 2012-03-11 ENCOUNTER — Encounter: Payer: Self-pay | Admitting: Rehabilitative and Restorative Service Providers"

## 2012-03-15 ENCOUNTER — Encounter: Payer: Self-pay | Admitting: Rehabilitative and Restorative Service Providers"

## 2012-03-18 ENCOUNTER — Encounter: Payer: Self-pay | Admitting: Rehabilitative and Restorative Service Providers"

## 2012-03-22 ENCOUNTER — Encounter: Payer: Self-pay | Admitting: Rehabilitative and Restorative Service Providers"

## 2012-03-25 ENCOUNTER — Encounter: Payer: Self-pay | Admitting: Rehabilitative and Restorative Service Providers"

## 2012-03-29 ENCOUNTER — Encounter: Payer: Self-pay | Admitting: Rehabilitative and Restorative Service Providers"

## 2012-04-12 ENCOUNTER — Ambulatory Visit: Attending: Orthopedic Surgery

## 2012-04-12 DIAGNOSIS — R262 Difficulty in walking, not elsewhere classified: Secondary | ICD-10-CM | POA: Insufficient documentation

## 2012-04-12 DIAGNOSIS — R5381 Other malaise: Secondary | ICD-10-CM | POA: Insufficient documentation

## 2012-04-12 DIAGNOSIS — IMO0001 Reserved for inherently not codable concepts without codable children: Secondary | ICD-10-CM | POA: Insufficient documentation

## 2012-04-12 DIAGNOSIS — M25569 Pain in unspecified knee: Secondary | ICD-10-CM | POA: Insufficient documentation

## 2012-04-12 DIAGNOSIS — M25669 Stiffness of unspecified knee, not elsewhere classified: Secondary | ICD-10-CM | POA: Insufficient documentation

## 2012-04-12 DIAGNOSIS — Z96659 Presence of unspecified artificial knee joint: Secondary | ICD-10-CM | POA: Insufficient documentation

## 2012-04-12 DIAGNOSIS — M1710 Unilateral primary osteoarthritis, unspecified knee: Secondary | ICD-10-CM | POA: Insufficient documentation

## 2013-05-26 ENCOUNTER — Encounter (HOSPITAL_COMMUNITY): Payer: Self-pay | Admitting: Emergency Medicine

## 2013-05-26 ENCOUNTER — Emergency Department (HOSPITAL_COMMUNITY): Payer: No Typology Code available for payment source

## 2013-05-26 ENCOUNTER — Emergency Department (HOSPITAL_COMMUNITY)
Admission: EM | Admit: 2013-05-26 | Discharge: 2013-05-26 | Disposition: A | Discharge status: Home / Self Care | Payer: No Typology Code available for payment source | Attending: Emergency Medicine | Admitting: Emergency Medicine

## 2013-05-26 DIAGNOSIS — I10 Essential (primary) hypertension: Secondary | ICD-10-CM | POA: Insufficient documentation

## 2013-05-26 DIAGNOSIS — S46909A Unspecified injury of unspecified muscle, fascia and tendon at shoulder and upper arm level, unspecified arm, initial encounter: Secondary | ICD-10-CM | POA: Insufficient documentation

## 2013-05-26 DIAGNOSIS — Y92410 Unspecified street and highway as the place of occurrence of the external cause: Secondary | ICD-10-CM | POA: Insufficient documentation

## 2013-05-26 DIAGNOSIS — E119 Type 2 diabetes mellitus without complications: Secondary | ICD-10-CM | POA: Insufficient documentation

## 2013-05-26 DIAGNOSIS — M25512 Pain in left shoulder: Secondary | ICD-10-CM

## 2013-05-26 DIAGNOSIS — Z79899 Other long term (current) drug therapy: Secondary | ICD-10-CM | POA: Insufficient documentation

## 2013-05-26 DIAGNOSIS — Z8619 Personal history of other infectious and parasitic diseases: Secondary | ICD-10-CM | POA: Insufficient documentation

## 2013-05-26 DIAGNOSIS — S99929A Unspecified injury of unspecified foot, initial encounter: Secondary | ICD-10-CM

## 2013-05-26 DIAGNOSIS — J45909 Unspecified asthma, uncomplicated: Secondary | ICD-10-CM | POA: Insufficient documentation

## 2013-05-26 DIAGNOSIS — Z794 Long term (current) use of insulin: Secondary | ICD-10-CM | POA: Insufficient documentation

## 2013-05-26 DIAGNOSIS — M25561 Pain in right knee: Secondary | ICD-10-CM

## 2013-05-26 DIAGNOSIS — Z96659 Presence of unspecified artificial knee joint: Secondary | ICD-10-CM | POA: Insufficient documentation

## 2013-05-26 DIAGNOSIS — S99919A Unspecified injury of unspecified ankle, initial encounter: Secondary | ICD-10-CM

## 2013-05-26 DIAGNOSIS — F17200 Nicotine dependence, unspecified, uncomplicated: Secondary | ICD-10-CM | POA: Insufficient documentation

## 2013-05-26 DIAGNOSIS — Y9389 Activity, other specified: Secondary | ICD-10-CM | POA: Insufficient documentation

## 2013-05-26 DIAGNOSIS — M129 Arthropathy, unspecified: Secondary | ICD-10-CM | POA: Insufficient documentation

## 2013-05-26 DIAGNOSIS — S199XXA Unspecified injury of neck, initial encounter: Principal | ICD-10-CM

## 2013-05-26 DIAGNOSIS — Z8719 Personal history of other diseases of the digestive system: Secondary | ICD-10-CM | POA: Insufficient documentation

## 2013-05-26 DIAGNOSIS — S4980XA Other specified injuries of shoulder and upper arm, unspecified arm, initial encounter: Secondary | ICD-10-CM | POA: Insufficient documentation

## 2013-05-26 DIAGNOSIS — S8990XA Unspecified injury of unspecified lower leg, initial encounter: Secondary | ICD-10-CM | POA: Insufficient documentation

## 2013-05-26 DIAGNOSIS — IMO0002 Reserved for concepts with insufficient information to code with codable children: Secondary | ICD-10-CM | POA: Insufficient documentation

## 2013-05-26 DIAGNOSIS — S0993XA Unspecified injury of face, initial encounter: Secondary | ICD-10-CM | POA: Insufficient documentation

## 2013-05-26 DIAGNOSIS — M542 Cervicalgia: Secondary | ICD-10-CM

## 2013-05-26 MED ORDER — HYDROCODONE-ACETAMINOPHEN 5-325 MG PO TABS: 1.0000 | ORAL_TABLET | Freq: Four times a day (QID) | ORAL | Status: DC | PRN

## 2013-05-26 MED ORDER — HYDROCODONE-ACETAMINOPHEN 5-325 MG PO TABS
1.0000 | ORAL_TABLET | Freq: Once | ORAL | Status: AC
  Administered 2013-05-26: 1 via ORAL
  Filled 2013-05-26: qty 1
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
## 2013-05-26 NOTE — ED Notes (Signed)
Patient transported to X-ray 

## 2013-05-30 NOTE — ED Provider Notes (Signed)
Medical screening examination/treatment/procedure(s) were performed by non-physician practitioner and as supervising physician I was immediately available for consultation/collaboration.   EKG Interpretation None        Ephraim Hamburger, MD 05/30/13 [phone]

## 2013-06-21 ENCOUNTER — Other Ambulatory Visit: Payer: Self-pay | Admitting: Orthopedic Surgery

## 2013-06-21 DIAGNOSIS — M25561 Pain in right knee: Secondary | ICD-10-CM

## 2013-06-21 DIAGNOSIS — R609 Edema, unspecified: Secondary | ICD-10-CM

## 2013-06-28 ENCOUNTER — Ambulatory Visit
Admission: RE | Admit: 2013-06-28 | Discharge: 2013-06-28 | Disposition: A | Discharge status: Home / Self Care | Payer: No Typology Code available for payment source | Source: Ambulatory Visit | Attending: Orthopedic Surgery | Admitting: Orthopedic Surgery

## 2013-06-28 DIAGNOSIS — M25561 Pain in right knee: Secondary | ICD-10-CM

## 2013-06-28 DIAGNOSIS — R609 Edema, unspecified: Secondary | ICD-10-CM

## 2014-08-22 ENCOUNTER — Ambulatory Visit: Payer: Non-veteran care | Admitting: Physical Therapy

## 2014-08-31 ENCOUNTER — Ambulatory Visit: Payer: No Typology Code available for payment source | Admitting: Physical Therapy

## 2014-09-18 ENCOUNTER — Ambulatory Visit: Payer: No Typology Code available for payment source | Admitting: Physical Therapy

## 2014-09-18 ENCOUNTER — Ambulatory Visit: Payer: No Typology Code available for payment source | Attending: Adult Health | Admitting: Physical Therapy

## 2014-09-18 DIAGNOSIS — M25561 Pain in right knee: Secondary | ICD-10-CM | POA: Diagnosis present

## 2014-09-18 DIAGNOSIS — R29898 Other symptoms and signs involving the musculoskeletal system: Secondary | ICD-10-CM | POA: Diagnosis present

## 2014-09-18 DIAGNOSIS — R269 Unspecified abnormalities of gait and mobility: Secondary | ICD-10-CM | POA: Insufficient documentation

## 2014-09-18 DIAGNOSIS — M25661 Stiffness of right knee, not elsewhere classified: Secondary | ICD-10-CM

## 2014-09-18 NOTE — Therapy (Signed)
[place], [place], [address] Phone: [phone]   Fax:  [phone]  Physical Therapy Evaluation  Patient Details  Name: Timothy Lane MRN: [id number] Date of Birth: 07-31-1950 Referring Provider:  Diona Browner, MD  Encounter Date: 09/18/2014      PT End of Session - 09/18/14 1142    Visit Number 1   Number of Visits 12   Date for PT Re-Evaluation 10/30/14   PT Start Time 1105   PT Stop Time 1150   PT Time Calculation (min) 45 min   Activity Tolerance Patient tolerated treatment well   Behavior During Therapy [facility] for tasks assessed/performed      Past Medical History  Diagnosis Date  . Hypertension   . Bronchitis   . Asthma   . Diabetes mellitus   . Stomach ulcer   . Arthritis   . Hepatitis     hepatitis C    Past Surgical History  Procedure Laterality Date  . Knee arthroscopy      left knee 2 surgeries  . Total knee arthroplasty  09/08/2011    Procedure: TOTAL KNEE ARTHROPLASTY;  Surgeon: Rudean Haskell, MD;  Location: [place];  Service: Orthopedics;  Laterality: Left;  left total knee arthroplasty    There were no vitals filed for this visit.  Visit Diagnosis:  Right knee pain  Decreased ROM of right knee  Abnormality of gait  Weakness of right leg      Subjective Assessment - 09/18/14 1115    Subjective pt is a 64 y.o M with CC of R knee pain following S/P R TKA 07/24/2014. since the surgery he reports that things are getting better. He reports he knows it takes time to come back from. The pain has somehwat subsided, and ROM is slowly improving   Limitations Walking;Standing;House hold activities   How long can you sit comfortably? unlimited   How long can you stand comfortably? 5/10 min   How long can you walk comfortably? 5-10 min   Diagnostic tests x -ray 2 weeks ago per pt report stated hardwar looked good   Patient Stated Goals to increase strength, ROM, to get back to  playing golf   Currently in Pain? Yes   Pain Score 5    Pain Location Knee   Pain Orientation Right   Pain Descriptors / Indicators Aching   Pain Onset 1 to 4 weeks ago   Pain Frequency Constant   Aggravating Factors  prolonged standing, walking, steps, bending, and sometimes just doing nothing   Pain Relieving Factors pain medication, ice,             [facility] PT Assessment - 09/18/14 1119    Assessment   Medical Diagnosis S/P R TKA    Onset Date/Surgical Date 07/24/14   Hand Dominance Right   Next MD Visit To make one as need in 2-3 months   Prior Therapy yes   Precautions   Precautions None   Restrictions   Weight Bearing Restrictions No   Balance Screen   Has the patient fallen in the past 6 months No   Has the patient had a decrease in activity level because of a fear of falling?  No   Is the patient reluctant to leave their home because of a fear of falling?  No   Home Environment   Living Environment Private residence   Living Arrangements Spouse/significant other   Available Help at Discharge Available PRN/intermittently;Available  24 hours/day   Type of [place] to enter   Entrance Stairs-Number of Steps 2   Entrance Stairs-Rails Can reach both   Home Layout One level   [place] - 2 wheels;Cane - single point   Prior Function   Level of Independence Independent;Independent with basic ADLs   Vocation --   Cognition   Overall Cognitive Status Within Functional Limits for tasks assessed   Observation/Other Assessments   Focus on Therapeutic Outcomes (FOTO)  69% limited  predicted 45%    Posture/Postural Control   Posture/Postural Control Postural limitations   Postural Limitations Rounded Shoulders;Forward head   ROM / Strength   AROM / PROM / Strength AROM;PROM;Strength   AROM   AROM Assessment Site Knee   Right/Left Knee Right;Left   Right Knee Extension -10   Right Knee Flexion 117   Left Knee Extension 0   Left Knee  Flexion 135   PROM   PROM Assessment Site Knee   Right/Left Knee Right;Left   Left Knee Extension -7  assessed on R knee   Left Knee Flexion 120   Strength   Strength Assessment Site Knee   Right/Left Knee Right;Left   Right Knee Flexion 4/5   Right Knee Extension 4/5   Left Knee Flexion 5/5   Left Knee Extension 5/5   Palpation   Palpation comment tenderness located at the proximal gastrocnemius tendons and distal tendons of the semitendinousus, and memnbranosus   Ambulation/Gait   Gait Pattern Within Functional Limits;Step-through pattern;Decreased stance time - right                   [facility] Adult PT Treatment/Exercise - 09/18/14 1119    Knee/Hip Exercises: Stretches   Passive Hamstring Stretch 1 rep;30 seconds   Quad Stretch 1 rep;30 seconds   Knee/Hip Exercises: Aerobic   Stationary Bike L1 x 10                PT Education - 09/18/14 1141    Education provided Yes   Education Details evaluation findings, POC, Goals, HEP   Person(s) Educated Patient   Methods Explanation   Comprehension Verbalized understanding          PT Short Term Goals - 09/18/14 1256    PT SHORT TERM GOAL #1   Title Pt will be I with initial HEP (10/09/2014)   Time 3   Period Weeks   Status New   PT SHORT TERM GOAL #2   Title He will increase his FOTO score by > 10 points to assist with increased functional capacity (10/09/2014)   Time 3   Period Weeks   Status New           PT Long Term Goals - 09/18/14 1257    PT LONG TERM GOAL #1   Title upon discharge pt will be I with all HEP given throughout HEP (10/30/2014)   Time 6   Period Weeks   Status New   PT LONG TERM GOAL #2   Title He will increaes RLE strength to > 4+/5 to assist with endurance for prolong walking/standing activities (10/30/2014)   Time 6   Period Weeks   Status New   PT LONG TERM GOAL #3   Title He will increase R knee AROM to 0 - 125 degrees to assist with functional and efficient gait  pattern (10/30/2014)   Time 6   Period Weeks   Status New  PT LONG TERM GOAL #4   Title pt will be able to walking/stand for > 30 min and navigate >10 steps with < 2/10 pain to assist with community ambulaiton activities (10/30/2014)   Time 6   Period Weeks   Status New   PT LONG TERM GOAL #5   Title He will increase FOTO score to > 55 to demontrate improved functional capacity at discharge (10/30/2014)   Time [place] - 2014-10-01 1142    Clinical Impression Statement Tha presents to OPPT with CC of R knee pain s/p R TKA on 07/24/2014. he reports the pain is getting better but he continues to require pain medication intermittently. He demonstrates mild decreased AROM of the R knee at -10 to 117, and weakness to 4/5 compared bil. pt reports that he has been through some pt but hasn't seen anyone for 1 month since the surgery. he currently ambulates WFL with decreased stance time on RLE and decreased step length on L. He would benefit from skilled physical therapy to maximize his function by addressing the impairments listed.    Pt will benefit from skilled therapeutic intervention in order to improve on the following deficits Abnormal gait;Decreased balance;Decreased activity tolerance;Decreased endurance;Decreased range of motion;Decreased strength;Increased edema;Postural dysfunction;Improper body mechanics;Impaired flexibility;Pain;Hypomobility;Decreased mobility   Rehab Potential Good   PT Frequency 2x / week   PT Duration 6 weeks   PT Treatment/Interventions ADLs/Self Care Home Management;Cryotherapy;Electrical Stimulation;Moist Heat;Gait training;Stair training;Functional mobility training;Therapeutic activities;Therapeutic exercise;Balance training;Neuromuscular re-education;Patient/family education;Manual techniques;Dry needling;Taping;Vasopneumatic Device   PT Next Visit Plan assess response to HEP, knee mobilizations, hip/knee  strengthening, modalities PRN   PT Home Exercise Plan see HEP handout   Consulted and Agree with Plan of Care Patient          G-Codes - Oct 01, 2014 1305    Functional Limitation Mobility: Walking and moving around   Mobility: Walking and Moving Around Current Status [phone]) At least 60 percent but less than 80 percent impaired, limited or restricted   Mobility: Walking and Moving Around Goal Status [phone]) At least 40 percent but less than 60 percent impaired, limited or restricted       Problem List Patient Active Problem List   Diagnosis Date Noted  . S/P total knee arthroplasty 09/10/2011  . Tobacco abuse 09/10/2011  . DM type 2, uncontrolled, with neuropathy 09/10/2011  . HTN (hypertension) 09/10/2011  . Hepatitis C 09/10/2011   Starr Lake PT, DPT, LAT, ATC  10/01/2014  1:05 PM     [facility] [address] [place], [place], [address] Phone: [phone]   Fax:  [phone]

## 2014-09-18 NOTE — Patient Instructions (Signed)
   [name] PT, DPT, LAT, ATC  [facility] Phone: [phone]     

## 2014-09-26 ENCOUNTER — Ambulatory Visit: Payer: No Typology Code available for payment source | Admitting: Physical Therapy

## 2014-09-26 DIAGNOSIS — M25561 Pain in right knee: Secondary | ICD-10-CM | POA: Diagnosis not present

## 2014-09-26 DIAGNOSIS — M25661 Stiffness of right knee, not elsewhere classified: Secondary | ICD-10-CM

## 2014-09-26 DIAGNOSIS — R29898 Other symptoms and signs involving the musculoskeletal system: Secondary | ICD-10-CM

## 2014-09-26 NOTE — Therapy (Signed)
[place], [place], [address] Phone: [phone]   Fax:  [phone]  Physical Therapy Treatment  Patient Details  Name: Timothy Lane MRN: [id number] Date of Birth: May 31, 1950 Referring Provider:  Diona Browner, MD  Encounter Date: 09/26/2014      PT End of Session - 09/26/14 1147    Visit Number 2   Number of Visits 12   Date for PT Re-Evaluation 10/30/14   PT Start Time 1147   PT Stop Time 1242   PT Time Calculation (min) 55 min   Activity Tolerance Patient tolerated treatment well   Behavior During Therapy [facility] for tasks assessed/performed      Past Medical History  Diagnosis Date  . Hypertension   . Bronchitis   . Asthma   . Diabetes mellitus   . Stomach ulcer   . Arthritis   . Hepatitis     hepatitis C    Past Surgical History  Procedure Laterality Date  . Knee arthroscopy      left knee 2 surgeries  . Total knee arthroplasty  09/08/2011    Procedure: TOTAL KNEE ARTHROPLASTY;  Surgeon: Rudean Haskell, MD;  Location: [place];  Service: Orthopedics;  Laterality: Left;  left total knee arthroplasty    There were no vitals filed for this visit.  Visit Diagnosis:  Right knee pain  Weakness of right leg  Decreased ROM of right knee      Subjective Assessment - 09/26/14 1150    Subjective Pt reports 5/10 pain in right knee.   Currently in Pain? Yes   Pain Score 5    Pain Location Knee   Pain Orientation Right   Pain Descriptors / Indicators Aching   Pain Type Surgical pain   Pain Onset 1 to 4 weeks ago   Pain Frequency Constant                         [facility] Adult PT Treatment/Exercise - 09/26/14 0001    Exercises   Exercises Lumbar   Lumbar Exercises: Machines for Strengthening   Cybex Knee Extension 15# x 15 (slow count down)   Cybex Knee Flexion 45# x 15   Leg Press 4 plates x  20   Stationary Bike L4 x 10 min  for ROM and endurance   Knee/Hip  Exercises: Standing   Terminal Knee Extension Limitations 5 sec hold x 15  green band   Modalities   Modalities Vasopneumatic   Cryotherapy   Number Minutes Cryotherapy 15 Minutes   Vasopneumatic   Number Minutes Vasopneumatic  15 minutes   Vasopnuematic Location  Knee   Vasopneumatic Pressure Medium   Vasopneumatic Temperature  36                  PT Short Term Goals - 09/18/14 1256    PT SHORT TERM GOAL #1   Title Pt will be I with initial HEP (10/09/2014)   Time 3   Period Weeks   Status New   PT SHORT TERM GOAL #2   Title He will increase his FOTO score by > 10 points to assist with increased functional capacity (10/09/2014)   Time 3   Period Weeks   Status New           PT Long Term Goals - 09/18/14 1257    PT LONG TERM GOAL #1   Title upon discharge pt will be I with  all HEP given throughout HEP (10/30/2014)   Time 6   Period Weeks   Status New   PT LONG TERM GOAL #2   Title He will increaes RLE strength to > 4+/5 to assist with endurance for prolong walking/standing activities (10/30/2014)   Time 6   Period Weeks   Status New   PT LONG TERM GOAL #3   Title He will increase R knee AROM to 0 - 125 degrees to assist with functional and efficient gait pattern (10/30/2014)   Time 6   Period Weeks   Status New   PT LONG TERM GOAL #4   Title pt will be able to walking/stand for > 30 min and navigate >10 steps with < 2/10 pain to assist with community ambulaiton activities (10/30/2014)   Time 6   Period Weeks   Status New   PT LONG TERM GOAL #5   Title He will increase FOTO score to > 55 to demontrate improved functional capacity at discharge (10/30/2014)   Time 6   Period Weeks   Status New               Plan - 09/26/14 1232    Clinical Impression Statement Patient tolerated therex well with minimal pain increase. He is progressing well with strengthening and pushes himself hard.    PT Next Visit Plan Continue therex, ROM, and modalities prn,  assess balance        Problem List Patient Active Problem List   Diagnosis Date Noted  . S/P total knee arthroplasty 09/10/2011  . Tobacco abuse 09/10/2011  . DM type 2, uncontrolled, with neuropathy 09/10/2011  . HTN (hypertension) 09/10/2011  . Hepatitis C 09/10/2011    Madelyn Flavors PT  09/26/2014, 12:37 PM  [facility] [address] [place], [place], [address] Phone: [phone]   Fax:  [phone]

## 2014-09-27 ENCOUNTER — Encounter: Payer: Self-pay | Admitting: Physical Therapy

## 2014-10-03 ENCOUNTER — Ambulatory Visit: Payer: No Typology Code available for payment source | Admitting: Physical Therapy

## 2014-10-03 DIAGNOSIS — M25561 Pain in right knee: Secondary | ICD-10-CM

## 2014-10-03 DIAGNOSIS — R269 Unspecified abnormalities of gait and mobility: Secondary | ICD-10-CM

## 2014-10-03 DIAGNOSIS — R29898 Other symptoms and signs involving the musculoskeletal system: Secondary | ICD-10-CM

## 2014-10-03 DIAGNOSIS — M25661 Stiffness of right knee, not elsewhere classified: Secondary | ICD-10-CM

## 2014-10-03 NOTE — Therapy (Signed)
[place], [place], [address] Phone: [phone]   Fax:  [phone]  Physical Therapy Treatment  Patient Details  Name: Timothy Lane MRN: [id number] Date of Birth: Nov 12, 1950 Referring Provider:  Diona Browner, MD  Encounter Date: 10/03/2014      PT End of Session - 10/03/14 1338    Visit Number 3   Number of Visits 12   Date for PT Re-Evaluation 10/30/14   PT Start Time 0137   PT Stop Time 0230   PT Time Calculation (min) 53 min      Past Medical History  Diagnosis Date  . Hypertension   . Bronchitis   . Asthma   . Diabetes mellitus   . Stomach ulcer   . Arthritis   . Hepatitis     hepatitis C    Past Surgical History  Procedure Laterality Date  . Knee arthroscopy      left knee 2 surgeries  . Total knee arthroplasty  09/08/2011    Procedure: TOTAL KNEE ARTHROPLASTY;  Surgeon: Rudean Haskell, MD;  Location: [place];  Service: Orthopedics;  Laterality: Left;  left total knee arthroplasty    There were no vitals filed for this visit.  Visit Diagnosis:  Right knee pain  Weakness of right leg  Decreased ROM of right knee  Abnormality of gait      Subjective Assessment - 10/03/14 1339    Subjective It only hurts when I am in an awkward position like trying to swing a golf club   Currently in Pain? Yes   Pain Score 4    Pain Location Knee   Pain Orientation Right   Aggravating Factors  golf swings   Pain Relieving Factors pain med            [facility] PT Assessment - 10/03/14 1410    AROM   Right Knee Extension -7   Right Knee Flexion 117   PROM   Right/Left Knee Right   Right Knee Flexion 127                     [facility] Adult PT Treatment/Exercise - 10/03/14 1356    Lumbar Exercises: Machines for Strengthening   Leg Press 4 plates x  20, 2 plates single leg x 20   Knee/Hip Exercises: Aerobic   Stationary Bike L3 x 4 min   Knee/Hip Exercises: Standing   Knee Flexion Right;15 reps   Knee Flexion Limitations  5#   SLS 22   SLS with Vectors and red theraband x 10 each way using foam roller for stability   Knee/Hip Exercises: Seated   Sit to Sand 10 reps   Knee/Hip Exercises: Supine   Quad Sets 10 reps                  PT Short Term Goals - 09/18/14 1256    PT SHORT TERM GOAL #1   Title Pt will be I with initial HEP (10/09/2014)   Time 3   Period Weeks   Status New   PT SHORT TERM GOAL #2   Title He will increase his FOTO score by > 10 points to assist with increased functional capacity (10/09/2014)   Time 3   Period Weeks   Status New           PT Long Term Goals - 09/18/14 1257    PT LONG TERM GOAL #1   Title upon discharge pt  will be I with all HEP given throughout HEP (10/30/2014)   Time 6   Period Weeks   Status New   PT LONG TERM GOAL #2   Title He will increaes RLE strength to > 4+/5 to assist with endurance for prolong walking/standing activities (10/30/2014)   Time 6   Period Weeks   Status New   PT LONG TERM GOAL #3   Title He will increase R knee AROM to 0 - 125 degrees to assist with functional and efficient gait pattern (10/30/2014)   Time 6   Period Weeks   Status New   PT LONG TERM GOAL #4   Title pt will be able to walking/stand for > 30 min and navigate >10 steps with < 2/10 pain to assist with community ambulaiton activities (10/30/2014)   Time 6   Period Weeks   Status New   PT LONG TERM GOAL #5   Title He will increase FOTO score to > 55 to demontrate improved functional capacity at discharge (10/30/2014)   Time 6   Period Weeks   Status New               Plan - 10/04/14 1021    Clinical Impression Statement Focus on stability and balance using SLS red band vectors on opposite leg. Pt with difficulty and required foam roller for balance.    PT Next Visit Plan Continue therex, ROM, and modalities prn, assess balance        Problem List Patient Active Problem List   Diagnosis  Date Noted  . S/P total knee arthroplasty 09/10/2011  . Tobacco abuse 09/10/2011  . DM type 2, uncontrolled, with neuropathy 09/10/2011  . HTN (hypertension) 09/10/2011  . Hepatitis C 09/10/2011    Dorene Ar, PTA 10/04/2014, 10:22 AM  [facility] [address] [place], [place], [address] Phone: [phone]   Fax:  [phone]

## 2014-10-05 ENCOUNTER — Ambulatory Visit: Payer: No Typology Code available for payment source | Attending: Adult Health | Admitting: Physical Therapy

## 2014-10-05 DIAGNOSIS — R269 Unspecified abnormalities of gait and mobility: Secondary | ICD-10-CM | POA: Diagnosis present

## 2014-10-05 DIAGNOSIS — M25561 Pain in right knee: Secondary | ICD-10-CM | POA: Diagnosis present

## 2014-10-05 DIAGNOSIS — R29898 Other symptoms and signs involving the musculoskeletal system: Secondary | ICD-10-CM | POA: Insufficient documentation

## 2014-10-05 DIAGNOSIS — M25661 Stiffness of right knee, not elsewhere classified: Secondary | ICD-10-CM

## 2014-10-05 NOTE — Therapy (Signed)
[place], [place], [address] Phone: [phone]   Fax:  [phone]  Physical Therapy Treatment  Patient Details  Name: Timothy Lane MRN: [id number] Date of Birth: 1950-05-01 Referring Provider:  Diona Browner, MD  Encounter Date: 10/05/2014      PT End of Session - 10/05/14 1423    Visit Number 4   Number of Visits 12   Date for PT Re-Evaluation 10/30/14   PT Start Time 0215   PT Stop Time 0315   PT Time Calculation (min) 60 min      Past Medical History  Diagnosis Date  . Hypertension   . Bronchitis   . Asthma   . Diabetes mellitus   . Stomach ulcer   . Arthritis   . Hepatitis     hepatitis C    Past Surgical History  Procedure Laterality Date  . Knee arthroscopy      left knee 2 surgeries  . Total knee arthroplasty  09/08/2011    Procedure: TOTAL KNEE ARTHROPLASTY;  Surgeon: Rudean Haskell, MD;  Location: [place];  Service: Orthopedics;  Laterality: Left;  left total knee arthroplasty    There were no vitals filed for this visit.  Visit Diagnosis:  Right knee pain  Weakness of right leg  Decreased ROM of right knee  Abnormality of gait      Subjective Assessment - 10/05/14 1425    Subjective I did alot of physical activity yesterday   Currently in Pain? Yes   Pain Score 3    Pain Location Knee   Pain Orientation Right            [facility] PT Assessment - 10/05/14 1428    AROM   Right Knee Flexion 120                     [facility] Adult PT Treatment/Exercise - 10/05/14 1429    Lumbar Exercises: Machines for Strengthening   Cybex Knee Extension 15# x 20 single (slow count down)   Cybex Knee Flexion 45# x 15  plus 5 eccentric only   Knee/Hip Exercises: Stretches   Active Hamstring Stretch 3 reps;30 seconds   Knee/Hip Exercises: Aerobic   Stationary Bike L3 x 10 minutes   Knee/Hip Exercises: Standing   Forward Step Up 20 reps;Hand Hold: 0;Step Height: 6"   SLS 20   SLS with Vectors and red theraband x 15 each way using foam roller for stability bilateral   Knee/Hip Exercises: Supine   Heel Slides AROM;AAROM;5 reps   Modalities   Modalities Vasopneumatic   Vasopneumatic   Number Minutes Vasopneumatic  15 minutes   Vasopnuematic Location  Knee   Vasopneumatic Pressure Medium   Vasopneumatic Temperature  36                  PT Short Term Goals - 10/05/14 1424    PT SHORT TERM GOAL #1   Title Pt will be I with initial HEP (10/09/2014)   Time 3   Period Weeks   Status Achieved   PT SHORT TERM GOAL #2   Title He will increase his FOTO score by > 10 points to assist with increased functional capacity (10/09/2014)   Time 3   Period Weeks   Status On-going           PT Long Term Goals - 10/05/14 1427    PT LONG TERM GOAL #1   Title upon discharge  pt will be I with all HEP given throughout HEP (10/30/2014)   Time 6   Period Weeks   Status On-going   PT LONG TERM GOAL #2   Title He will increaes RLE strength to > 4+/5 to assist with endurance for prolong walking/standing activities (10/30/2014)   Time 6   Period Weeks   Status On-going   PT LONG TERM GOAL #3   Title He will increase R knee AROM to 0 - 125 degrees to assist with functional and efficient gait pattern (10/30/2014)   Time 6   Period Weeks   Status On-going   PT LONG TERM GOAL #4   Title pt will be able to walking/stand for > 30 min and navigate >10 steps with < 2/10 pain to assist with community ambulaiton activities (10/30/2014)   Time 6   Period Weeks   Status On-going   PT LONG TERM GOAL #5   Title He will increase FOTO score to > 55 to demontrate improved functional capacity at discharge (10/30/2014)   Time 6   Period Weeks   Status On-going     Clinical Impression Statement: Pt reports he is unable to stand or walk for 30 minutes. He also reports he is unable to climb 10 stairs. Worked on quad and hamstring strengthening as well as SLS and hip  stability in standing. Pt tolerates exercises well without complaints of increased pain. He demonstrates continued edema around patell and distal quads. Vasoneumatic device used for edema management.  Progressing toward LTG#3 with AROM 122 knee flexion after treatment.   PT Plan next visit: Continue therex, ROM, balance and modalities prn, tape for edema or retro massage          Problem List Patient Active Problem List   Diagnosis Date Noted  . S/P total knee arthroplasty 09/10/2011  . Tobacco abuse 09/10/2011  . DM type 2, uncontrolled, with neuropathy 09/10/2011  . HTN (hypertension) 09/10/2011  . Hepatitis C 09/10/2011    Dorene Ar, PTA 10/05/2014, 3:19 PM  [facility] [address] [place], [place], [address] Phone: [phone]   Fax:  [phone]

## 2014-10-10 ENCOUNTER — Ambulatory Visit: Payer: No Typology Code available for payment source | Admitting: Physical Therapy

## 2014-10-10 DIAGNOSIS — R29898 Other symptoms and signs involving the musculoskeletal system: Secondary | ICD-10-CM

## 2014-10-10 DIAGNOSIS — M25561 Pain in right knee: Secondary | ICD-10-CM

## 2014-10-10 DIAGNOSIS — M25661 Stiffness of right knee, not elsewhere classified: Secondary | ICD-10-CM

## 2014-10-10 DIAGNOSIS — R269 Unspecified abnormalities of gait and mobility: Secondary | ICD-10-CM

## 2014-10-10 NOTE — Patient Instructions (Signed)
   Adin Lariccia PT, DPT, LAT, ATC  [facility] Phone: [phone]     

## 2014-10-10 NOTE — Therapy (Signed)
[place], [place], [address] Phone: [phone]   Fax:  [phone]  Physical Therapy Treatment  Patient Details  Name: Timothy Lane MRN: [id number] Date of Birth: [birth year] Referring Provider:  Diona Browner, MD  Encounter Date: 10/10/2014      PT End of Session - 10/10/14 1459    Visit Number 5   Number of Visits 12   Date for PT Re-Evaluation 10/30/14   PT Start Time [id number]   PT Stop Time 1510   PT Time Calculation (min) 55 min   Activity Tolerance Patient tolerated treatment well   Behavior During Therapy [facility] for tasks assessed/performed      Past Medical History  Diagnosis Date  . Hypertension   . Bronchitis   . Asthma   . Diabetes mellitus   . Stomach ulcer   . Arthritis   . Hepatitis     hepatitis C    Past Surgical History  Procedure Laterality Date  . Knee arthroscopy      left knee 2 surgeries  . Total knee arthroplasty  09/08/2011    Procedure: TOTAL KNEE ARTHROPLASTY;  Surgeon: Rudean Haskell, MD;  Location: [facility];  Service: Orthopedics;  Laterality: Left;  left total knee arthroplasty    There were no vitals filed for this visit.  Visit Diagnosis:  Right knee pain  Weakness of right leg  Decreased ROM of right knee  Abnormality of gait      Subjective Assessment - 10/10/14 1414    Subjective "I did alot of the work, and am feeling sore today" pt reports he doesn't know what he feels he is able to do today.    Currently in Pain? Yes   Pain Score 2    Pain Location Knee   Pain Orientation Right   Pain Descriptors / Indicators Aching   Pain Type Surgical pain   Pain Onset More than a month ago   Pain Frequency Constant   Aggravating Factors  golf swings,    Pain Relieving Factors ice            [facility] PT Assessment - 10/10/14 0001    AROM   Right Knee Extension 0   Right Knee Flexion 120  130 following manual                     [facility]  Adult PT Treatment/Exercise - 10/10/14 1418    Lumbar Exercises: Stretches   Passive Hamstring Stretch --  PNF contract relax with 10 sec hold   Knee/Hip Exercises: Aerobic   Stationary Bike L2 x 10 min   Knee/Hip Exercises: Standing   Knee Flexion Right;15 reps   Knee Flexion Limitations  5#   Forward Step Up 20 reps;Hand Hold: 0;Step Height: 6"   SLS 20   Knee/Hip Exercises: Seated   Sit to Sand without UE support;5 reps;2 sets  tried 1 set of 10, but had to rest at 5   Knee/Hip Exercises: Supine   Heel Slides AROM;AAROM;10 reps  with strap holding for 10 sec   Bridges with Clamshell --   Straight Leg Raises AROM;Strengthening;Right;1 set;15 reps  5#   Vasopneumatic   Number Minutes Vasopneumatic  15 minutes   Vasopnuematic Location  Knee   Vasopneumatic Pressure Medium   Vasopneumatic Temperature  36   Manual Therapy   Manual Therapy Joint mobilization   Joint Mobilization Grade 3 P<>A tibiofemoral mobs, and grade 3 patellar mobs.  PT Education - 10/10/14 1458    Education provided Yes   Education Details updated HEP   Person(s) Educated Patient   Methods Explanation   Comprehension Verbalized understanding          PT Short Term Goals - 10/05/14 1424    PT SHORT TERM GOAL #1   Title Pt will be I with initial HEP (10/09/2014)   Time 3   Period Weeks   Status Achieved   PT SHORT TERM GOAL #2   Title He will increase his FOTO score by > 10 points to assist with increased functional capacity (10/09/2014)   Time 3   Period Weeks   Status On-going           PT Long Term Goals - 10/05/14 1427    PT LONG TERM GOAL #1   Title upon discharge pt will be I with all HEP given throughout HEP (10/30/2014)   Time 6   Period Weeks   Status On-going   PT LONG TERM GOAL #2   Title He will increaes RLE strength to > 4+/5 to assist with endurance for prolong walking/standing activities (10/30/2014)   Time 6   Period Weeks   Status On-going   PT  LONG TERM GOAL #3   Title He will increase R knee AROM to 0 - 125 degrees to assist with functional and efficient gait pattern (10/30/2014)   Time 6   Period Weeks   Status On-going   PT LONG TERM GOAL #4   Title pt will be able to walking/stand for > 30 min and navigate >10 steps with < 2/10 pain to assist with community ambulaiton activities (10/30/2014)   Time 6   Period Weeks   Status On-going   PT LONG TERM GOAL #5   Title He will increase FOTO score to > 55 to demontrate improved functional capacity at discharge (10/30/2014)   Time 6   Period Weeks   Status On-going               Plan - 10/10/14 1554    Clinical Impression Statement Timothy Lane reports that he was a little sore today from doing yard work yesterday. He maintained 120 degrees of knee flexion which following mobilizations and knee flexion with strap it improved to 130 degrees but he continues to report tightness in the knee. He was able to maintain tandem balance 2 x 30 seconds bil with milo-mod postural sway without losing his balance. Vasopnuematic compression used for pain and edema following todays session. pt conitnues to make good progress toward his goals.    PT Next Visit Plan Continue therex, ROM, balance and modalities prn, tape for edema or retro massage, manual for AROM.    PT Home Exercise Plan tandem balance in corner        Problem List Patient Active Problem List   Diagnosis Date Noted  . S/P total knee arthroplasty 09/10/2011  . Tobacco abuse 09/10/2011  . DM type 2, uncontrolled, with neuropathy 09/10/2011  . HTN (hypertension) 09/10/2011  . Hepatitis C 09/10/2011   Starr Lake PT, DPT, LAT, ATC  10/10/2014  4:02 PM      [facility] [address] [place], [place], [address] Phone: [phone]   Fax:  [phone]

## 2014-10-12 ENCOUNTER — Ambulatory Visit: Payer: No Typology Code available for payment source | Admitting: Physical Therapy

## 2014-10-12 DIAGNOSIS — R29898 Other symptoms and signs involving the musculoskeletal system: Secondary | ICD-10-CM

## 2014-10-12 DIAGNOSIS — M25561 Pain in right knee: Secondary | ICD-10-CM

## 2014-10-12 DIAGNOSIS — R269 Unspecified abnormalities of gait and mobility: Secondary | ICD-10-CM

## 2014-10-12 DIAGNOSIS — M25661 Stiffness of right knee, not elsewhere classified: Secondary | ICD-10-CM

## 2014-10-13 NOTE — Therapy (Signed)
[place], [place], [address] Phone: [phone]   Fax:  [phone]  Physical Therapy Treatment  Patient Details  Name: Timothy Lane MRN: [id number] Date of Birth: [birth year] Referring Provider:  Diona Browner, MD  Encounter Date: 10/12/2014      PT End of Session - 10/12/14 1334    Visit Number 6   Number of Visits 12   Date for PT Re-Evaluation 10/30/14   PT Start Time 0130   PT Stop Time 0230   PT Time Calculation (min) 60 min      Past Medical History  Diagnosis Date  . Hypertension   . Bronchitis   . Asthma   . Diabetes mellitus   . Stomach ulcer   . Arthritis   . Hepatitis     hepatitis C    Past Surgical History  Procedure Laterality Date  . Knee arthroscopy      left knee 2 surgeries  . Total knee arthroplasty  09/08/2011    Procedure: TOTAL KNEE ARTHROPLASTY;  Surgeon: Rudean Haskell, MD;  Location: [facility];  Service: Orthopedics;  Laterality: Left;  left total knee arthroplasty    There were no vitals filed for this visit.  Visit Diagnosis:  Right knee pain  Weakness of right leg  Decreased ROM of right knee  Abnormality of gait      Subjective Assessment - 10/12/14 1333    Subjective He worked me last time.    Currently in Pain? Yes   Pain Score 2    Pain Location Knee   Pain Orientation Right                         [facility] Adult PT Treatment/Exercise - 10/12/14 1336    Lumbar Exercises: Machines for Strengthening   Cybex Knee Extension 15# x20  single leg eccentrics   Cybex Knee Flexion 45# x 15  plus 5 eccentric only   Leg Press 4 plates x20   Knee/Hip Exercises: Aerobic   Stationary Bike L4 x 10 min   Knee/Hip Exercises: Standing   Forward Step Up 20 reps;Hand Hold: 0;Step Height: 6"   SLS 24   Rebounder red ball 5 toss best, trial of foam pad- unable to establish balance   Other Standing Knee Exercises resisted gait 3 plates x 4 each  forward and back, 3 plates side step x 3 each way   Vasopneumatic   Number Minutes Vasopneumatic  15 minutes   Vasopnuematic Location  Knee   Vasopneumatic Pressure Medium   Vasopneumatic Temperature  36                  PT Short Term Goals - 10/05/14 1424    PT SHORT TERM GOAL #1   Title Pt will be I with initial HEP (10/09/2014)   Time 3   Period Weeks   Status Achieved   PT SHORT TERM GOAL #2   Title He will increase his FOTO score by > 10 points to assist with increased functional capacity (10/09/2014)   Time 3   Period Weeks   Status On-going           PT Long Term Goals - 10/05/14 1427    PT LONG TERM GOAL #1   Title upon discharge pt will be I with all HEP given throughout HEP (10/30/2014)   Time 6   Period Weeks   Status On-going   PT  LONG TERM GOAL #2   Title He will increaes RLE strength to > 4+/5 to assist with endurance for prolong walking/standing activities (10/30/2014)   Time 6   Period Weeks   Status On-going   PT LONG TERM GOAL #3   Title He will increase R knee AROM to 0 - 125 degrees to assist with functional and efficient gait pattern (10/30/2014)   Time 6   Period Weeks   Status On-going   PT LONG TERM GOAL #4   Title pt will be able to walking/stand for > 30 min and navigate >10 steps with < 2/10 pain to assist with community ambulaiton activities (10/30/2014)   Time 6   Period Weeks   Status On-going   PT LONG TERM GOAL #5   Title He will increase FOTO score to > 55 to demontrate improved functional capacity at discharge (10/30/2014)   Time 6   Period Weeks   Status On-going               Plan - 10/12/14 0801    Clinical Impression Statement Focus today on strengthening and balance using resisted gait and rebounder toss and gym equipment. No increased pain post treatment.   PT Next Visit Plan Continue therex, ROM, balance and modalities prn, tape for edema or retro massage, manual for AROM.         Problem List Patient  Active Problem List   Diagnosis Date Noted  . S/P total knee arthroplasty 09/10/2011  . Tobacco abuse 09/10/2011  . DM type 2, uncontrolled, with neuropathy 09/10/2011  . HTN (hypertension) 09/10/2011  . Hepatitis C 09/10/2011    Dorene Ar, PTA 10/13/2014, 8:03 AM  [facility] [address] [place], [place], [address] Phone: [phone]   Fax:  [phone]

## 2014-10-17 ENCOUNTER — Ambulatory Visit: Payer: No Typology Code available for payment source | Admitting: Physical Therapy

## 2014-10-17 DIAGNOSIS — R29898 Other symptoms and signs involving the musculoskeletal system: Secondary | ICD-10-CM

## 2014-10-17 DIAGNOSIS — M25661 Stiffness of right knee, not elsewhere classified: Secondary | ICD-10-CM

## 2014-10-17 DIAGNOSIS — R269 Unspecified abnormalities of gait and mobility: Secondary | ICD-10-CM

## 2014-10-17 DIAGNOSIS — M25561 Pain in right knee: Secondary | ICD-10-CM | POA: Diagnosis not present

## 2014-10-17 NOTE — Therapy (Signed)
[place], [place], [address] Phone: [phone]   Fax:  [phone]  Physical Therapy Treatment  Patient Details  Name: Timothy Lane MRN: [id number] Date of Birth: 07-16-1950 Referring Provider:  Diona Browner, MD  Encounter Date: 10/17/2014      PT End of Session - 10/17/14 1337    Visit Number 7   Number of Visits 12   Date for PT Re-Evaluation 10/30/14   PT Start Time 0125   PT Stop Time 0230   PT Time Calculation (min) 65 min      Past Medical History  Diagnosis Date  . Hypertension   . Bronchitis   . Asthma   . Diabetes mellitus   . Stomach ulcer   . Arthritis   . Hepatitis     hepatitis C    Past Surgical History  Procedure Laterality Date  . Knee arthroscopy      left knee 2 surgeries  . Total knee arthroplasty  09/08/2011    Procedure: TOTAL KNEE ARTHROPLASTY;  Surgeon: Rudean Haskell, MD;  Location: [facility];  Service: Orthopedics;  Laterality: Left;  left total knee arthroplasty    There were no vitals filed for this visit.  Visit Diagnosis:  Right knee pain  Weakness of right leg  Decreased ROM of right knee  Abnormality of gait      Subjective Assessment - 10/17/14 1336    Subjective Knee was sore from last time for 3 days. Better today   Currently in Pain? No/denies            [facility] PT Assessment - 10/17/14 1407    Observation/Other Assessments   Focus on Therapeutic Outcomes (FOTO)  57% limited taken on 10/12/14                     [facility] Adult PT Treatment/Exercise - 10/17/14 1408    Lumbar Exercises: Machines for Strengthening   Cybex Knee Extension 15# x20 45# x 10 - weight raised by pt against advisement   single leg eccentrics   Cybex Knee Flexion 45# x 15 60# x 10  plus 5 eccentric only   Leg Press 4 plates x20   Knee/Hip Exercises: Aerobic   Stationary Bike L3 x 10 min   Knee/Hip Exercises: Standing   Rebounder red ball 5 toss best,  trial of foam pad- unable to establish balance   Other Standing Knee Exercises resisted gait 3 plates x 4 each forward and back, 3 plates side step x 3 each way   Modalities   Modalities Vasopneumatic   Vasopneumatic   Number Minutes Vasopneumatic  15 minutes   Vasopnuematic Location  Knee   Vasopneumatic Temperature  36                  PT Short Term Goals - 10/17/14 1418    PT SHORT TERM GOAL #1   Title Pt will be I with initial HEP (10/09/2014)   Time 3   Period Weeks   Status Achieved   PT SHORT TERM GOAL #2   Title He will increase his FOTO score by > 10 points to assist with increased functional capacity (10/09/2014)   Time 3   Period Weeks   Status Achieved           PT Long Term Goals - 10/05/14 1427    PT LONG TERM GOAL #1   Title upon discharge pt will be I with  all HEP given throughout HEP (10/30/2014)   Time 6   Period Weeks   Status On-going   PT LONG TERM GOAL #2   Title He will increaes RLE strength to > 4+/5 to assist with endurance for prolong walking/standing activities (10/30/2014)   Time 6   Period Weeks   Status On-going   PT LONG TERM GOAL #3   Title He will increase R knee AROM to 0 - 125 degrees to assist with functional and efficient gait pattern (10/30/2014)   Time 6   Period Weeks   Status On-going   PT LONG TERM GOAL #4   Title pt will be able to walking/stand for > 30 min and navigate >10 steps with < 2/10 pain to assist with community ambulaiton activities (10/30/2014)   Time 6   Period Weeks   Status On-going   PT LONG TERM GOAL #5   Title He will increase FOTO score to > 55 to demontrate improved functional capacity at discharge (10/30/2014)   Time 6   Period Weeks   Status On-going               Plan - 10/17/14 1416    Clinical Impression Statement Continued focus on strength and balance. Pt has been hitting golf balls without increased pain. No pain today. 1 more covered visit.    PT Next Visit Plan assess GOALS,  need more visits? last covered visit is next visit.         Problem List Patient Active Problem List   Diagnosis Date Noted  . S/P total knee arthroplasty 09/10/2011  . Tobacco abuse 09/10/2011  . DM type 2, uncontrolled, with neuropathy 09/10/2011  . HTN (hypertension) 09/10/2011  . Hepatitis C 09/10/2011    Dorene Ar, PTA 10/17/2014, 2:19 PM  [facility] [address] [place], [place], [address] Phone: [phone]   Fax:  [phone]

## 2014-10-19 ENCOUNTER — Ambulatory Visit: Payer: No Typology Code available for payment source | Admitting: Physical Therapy

## 2014-10-19 DIAGNOSIS — R269 Unspecified abnormalities of gait and mobility: Secondary | ICD-10-CM

## 2014-10-19 DIAGNOSIS — M25561 Pain in right knee: Secondary | ICD-10-CM | POA: Diagnosis not present

## 2014-10-19 DIAGNOSIS — M25661 Stiffness of right knee, not elsewhere classified: Secondary | ICD-10-CM

## 2014-10-19 DIAGNOSIS — R29898 Other symptoms and signs involving the musculoskeletal system: Secondary | ICD-10-CM

## 2014-10-19 NOTE — Therapy (Signed)
[facility] [address] [place], [place], [address] Phone: [phone]   Fax:  [phone]  Physical Therapy Treatment / Renewal  Patient Details  Name: Timothy Lane MRN: [id number] Date of Birth: [birth year] Referring Provider:  Randa Ngo, MD  Encounter Date: 10/19/2014      PT End of Session - 10/19/14 1745    Visit Number 8   Number of Visits 12   Date for PT Re-Evaluation 11/09/14   PT Start Time 1415   PT Stop Time 1510   PT Time Calculation (min) 55 min   Activity Tolerance Patient tolerated treatment well   Behavior During Therapy [facility] for tasks assessed/performed      Past Medical History  Diagnosis Date  . Hypertension   . Bronchitis   . Asthma   . Diabetes mellitus   . Stomach ulcer   . Arthritis   . Hepatitis     hepatitis C    Past Surgical History  Procedure Laterality Date  . Knee arthroscopy      left knee 2 surgeries  . Total knee arthroplasty  09/08/2011    Procedure: TOTAL KNEE ARTHROPLASTY;  Surgeon: Timothy Mutton, MD;  [facility];  Service: Orthopedics;  Laterality: Left;  left total knee arthroplasty    There were no vitals filed for this visit.  Visit Diagnosis:  Weakness of right leg - Plan: PT plan of care cert/re-cert  Decreased ROM of right knee - Plan: PT plan of care cert/re-cert  Abnormality of gait - Plan: PT plan of care cert/re-cert  Right knee pain - Plan: PT plan of care cert/re-cert      Subjective Assessment - 10/19/14 1418    Subjective "I don't really have any pain in the knee any more its more just stiffness"   Currently in Pain? No/denies   Pain Score 0-No pain   Pain Location Knee   Pain Orientation Right   Pain Descriptors / Indicators Aching   Pain Type Surgical pain   Pain Onset More than a month ago   Pain Frequency Rarely            [facility] PT Assessment - 10/19/14 1427    Assessment   Medical Diagnosis S/P R TKA    Onset Date/Surgical  Date 07/24/14   Hand Dominance Right   Prior Therapy yes   Precautions   Precautions None   Restrictions   Weight Bearing Restrictions No   Balance Screen   Has the patient fallen in the past 6 months No   Has the patient had a decrease in activity level because of a fear of falling?  No   Is the patient reluctant to leave their home because of a fear of falling?  No   Home Environment   Living Environment Private residence   Living Arrangements Spouse/significant other   Available Help at Discharge Available PRN/intermittently;Available 24 hours/day   Type of Home House   Home Access Stairs to enter   Entrance Stairs-Number of Steps 2   Entrance Stairs-Rails Can reach both   Home Layout One level   Home Equipment Walker - 2 wheels;Cane - single point   Prior Function   Level of Independence Independent;Independent with basic ADLs   Cognition   Overall Cognitive Status Within Functional Limits for tasks assessed   Observation/Other Assessments   Focus on Therapeutic Outcomes (FOTO)  57% limited taken on 10/12/14   AROM   Right Knee Extension 0  Right Knee Flexion 125  126 following therapy   PROM   Right Knee Flexion 131   Strength   Right Knee Flexion 4/5   Right Knee Extension 4+/5                     [facility] Adult PT Treatment/Exercise - 10/19/14 1421    Ambulation/Gait   Stairs Yes   Stairs Assistance 7: Independent   Stair Management Technique Alternating pattern   Number of Stairs 20   Height of Stairs 6   Lumbar Exercises: Aerobic   Stationary Bike L4 x 8 min   Lumbar Exercises: Machines for Strengthening   Cybex Knee Extension 30# x 20  up with both down with R only   Cybex Knee Flexion 60# 2 x 10  down with both up with R only   Leg Press 5 plates 2 x 10    Other Lumbar Machine Exercise hip cybex machine abduction x 10  with 3 plates, flexion x 10 with  2 plates   Lumbar Exercises: Standing   Heel Raises 15 reps  off edge of step   Knee/Hip  Exercises: Standing   Rebounder red ball 5 toss best   Modalities   Modalities Vasopneumatic   Vasopneumatic   Number Minutes Vasopneumatic  15 minutes   Vasopnuematic Location  Knee   Vasopneumatic Pressure High   Vasopneumatic Temperature  36   Manual Therapy   Manual Therapy Passive ROM   Passive ROM deep knee flexion 5 x 10 sec oscillations                PT Education - 10/19/14 1745    Education provided Yes   Education Details review HEP   Person(s) Educated Patient   Methods Explanation   Comprehension Verbalized understanding          PT Short Term Goals - 10/19/14 1423    PT SHORT TERM GOAL #1   Title Pt will be I with initial HEP (10/09/2014)   Time 3   Period Weeks   Status Achieved   PT SHORT TERM GOAL #2   Title He will increase his FOTO score by > 10 points to assist with increased functional capacity (10/09/2014)   Time 3   Period Weeks   Status Achieved           PT Long Term Goals - 10/19/14 1423    PT LONG TERM GOAL #1   Title upon discharge pt will be I with all HEP given throughout HEP (10/30/2014)   Time 6   Period Weeks   Status On-going   PT LONG TERM GOAL #2   Title He will increaes RLE strength to > 4+/5 to assist with endurance for prolong walking/standing activities (10/30/2014)   Time 6   Period Weeks   Status On-going   PT LONG TERM GOAL #3   Title He will increase R knee AROM to 0 - 125 degrees to assist with functional and efficient gait pattern (10/30/2014)   Time 6   Period Weeks   Status Achieved   PT LONG TERM GOAL #4   Title pt will be able to walking/stand for > 30 min and navigate >10 steps with < 2/10 pain to assist with community ambulaiton activities (10/30/2014)   Time 6   Period Weeks   Status On-going   PT LONG TERM GOAL #5   Title He will increase FOTO score to > 55 to demontrate improved functional  capacity at discharge (10/30/2014)   Time 6   Period Weeks   Status On-going               Plan -  Nov 17, 2014 1745    Clinical Impression Statement [name] continues to make progress with AROM and strength in the R knee. He continues to demostrate increased fatigue limited prolong walking/standing. He was able to complete all exercises today requring multiple rest breaks due to being fatigued. pt was able to navigate 20 steps with pain. LTG # 3 was met this visit. Plan to continue with current POF and progress toward the goals.    Pt will benefit from skilled therapeutic intervention in order to improve on the following deficits Abnormal gait;Decreased balance;Decreased activity tolerance;Decreased endurance;Decreased range of motion;Decreased strength;Increased edema;Postural dysfunction;Improper body mechanics;Impaired flexibility;Pain;Hypomobility;Decreased mobility   Rehab Potential Good   PT Frequency 2x / week   PT Duration 3 weeks   PT Treatment/Interventions ADLs/Self Care Home Management;Cryotherapy;Electrical Stimulation;Moist Heat;Gait training;Stair training;Functional mobility training;Therapeutic activities;Therapeutic exercise;Balance training;Neuromuscular re-education;Patient/family education;Manual techniques;Dry needling;Taping;Vasopneumatic Device   PT Next Visit Plan progress [facility] strengthening, balance training, modalities PRN   PT Home Exercise Plan no new HEP   Consulted and Agree with Plan of Care Patient          G-Codes - November 17, 2014 1749    Functional Assessment Tool Used FOTO 57% limited   Functional Limitation Mobility: Walking and moving around   Mobility: Walking and Moving Around Current Status [phone]) At least 40 percent but less than 60 percent impaired, limited or restricted   Mobility: Walking and Moving Around Goal Status [phone]) At least 40 percent but less than 60 percent impaired, limited or restricted      Problem List Patient Active Problem List   Diagnosis Date Noted  . S/P total knee arthroplasty 09/10/2011  . Tobacco abuse 09/10/2011  . DM type  2, uncontrolled, with neuropathy 09/10/2011  . HTN (hypertension) 09/10/2011  . Hepatitis C 09/10/2011   Starr Lake PT, DPT, LAT, ATC  2014/11/17  5:54 PM      [facility] [address] [place], [place], [address] Phone: [phone]   Fax:  [phone]

## 2014-10-24 ENCOUNTER — Ambulatory Visit: Payer: No Typology Code available for payment source | Admitting: Physical Therapy

## 2014-10-26 ENCOUNTER — Ambulatory Visit: Payer: No Typology Code available for payment source | Admitting: Physical Therapy

## 2014-11-13 ENCOUNTER — Ambulatory Visit: Payer: No Typology Code available for payment source | Attending: Adult Health | Admitting: Physical Therapy

## 2014-11-13 DIAGNOSIS — M25661 Stiffness of right knee, not elsewhere classified: Secondary | ICD-10-CM

## 2014-11-13 DIAGNOSIS — R269 Unspecified abnormalities of gait and mobility: Secondary | ICD-10-CM | POA: Insufficient documentation

## 2014-11-13 DIAGNOSIS — R29898 Other symptoms and signs involving the musculoskeletal system: Secondary | ICD-10-CM | POA: Insufficient documentation

## 2014-11-13 DIAGNOSIS — M25561 Pain in right knee: Secondary | ICD-10-CM | POA: Diagnosis present

## 2014-11-13 NOTE — Therapy (Signed)
[place], [place], [address] Phone: [phone]   Fax:  [phone]  Physical Therapy Treatment  Patient Details  Name: Timothy Lane MRN: [id number] Date of Birth: 1951-01-19 Referring Provider:  Diona Browner, MD  Encounter Date: 11/13/2014      PT End of Session - 11/13/14 1544    Visit Number 9   Number of Visits 12   PT Start Time 1450   PT Stop Time 1550   PT Time Calculation (min) 60 min   Equipment Utilized During Treatment Gait belt   Activity Tolerance Patient tolerated treatment well   Behavior During Therapy [facility] for tasks assessed/performed      Past Medical History  Diagnosis Date  . Hypertension   . Bronchitis   . Asthma   . Diabetes mellitus   . Stomach ulcer   . Arthritis   . Hepatitis     hepatitis C    Past Surgical History  Procedure Laterality Date  . Knee arthroscopy      left knee 2 surgeries  . Total knee arthroplasty  09/08/2011    Procedure: TOTAL KNEE ARTHROPLASTY;  Surgeon: Rudean Haskell, MD;  Location: [place];  Service: Orthopedics;  Laterality: Left;  left total knee arthroplasty    There were no vitals filed for this visit.  Visit Diagnosis:  Weakness of right leg  Decreased ROM of right knee  Abnormality of gait  Right knee pain      Subjective Assessment - 11/13/14 1457    Subjective 1/10 pain,  I always have a little pain . Most of the swelling has gone away.     Currently in Pain? Yes   Pain Score 1    Pain Location Knee   Pain Orientation Right   Pain Descriptors / Indicators Aching   Pain Frequency Constant   Aggravating Factors  twisting a strange way across my bed.     Pain Relieving Factors rest helps                         [facility] Adult PT Treatment/Exercise - 11/13/14 1500    High Level Balance   High Level Balance Comments Single legstand on BOSU ball, SBA 1-2 seconds.  Heel toe walking 20 feet 2 X close stand  by.     Knee/Hip Exercises: Aerobic   Stationary Bike L3 X 10 minutes   Knee/Hip Exercises: Machines for Strengthening   Cybex Knee Flexion 4 plates 20 X RT only, 10   Knee/Hip Exercises: Standing   Lateral Step Up Limitations 10 reps 2 sets cues   Forward Step Up 10 reps;2 sets   Knee/Hip Exercises: Supine   Heel Slides 10 reps  AArom   Vasopneumatic   Number Minutes Vasopneumatic  15 minutes   Vasopnuematic Location  Knee   Vasopneumatic Pressure Medium   Vasopneumatic Temperature  32   Manual Therapy   Passive ROM self mobilization with towel roll for knee flexion 130 best AA flexion RT                PT Education - 11/13/14 1543    Education provided Yes   Education Details self mobilization for flexion.   Person(s) Educated Patient   Methods Explanation;Demonstration;Tactile cues;Verbal cues   Comprehension Verbalized understanding;Returned demonstration          PT Short Term Goals - 10/19/14 1423    PT SHORT TERM GOAL #1  Title Pt will be I with initial HEP (10/09/2014)   Time 3   Period Weeks   Status Achieved   PT SHORT TERM GOAL #2   Title He will increase his FOTO score by > 10 points to assist with increased functional capacity (10/09/2014)   Time 3   Period Weeks   Status Achieved           PT Long Term Goals - 11/13/14 1546    PT LONG TERM GOAL #1   Title upon discharge pt will be I with all HEP given throughout HEP (10/30/2014)   Time 6   Period Weeks   Status On-going   PT LONG TERM GOAL #2   Title He will increaes RLE strength to > 4+/5 to assist with endurance for prolong walking/standing activities (10/30/2014)   Time 6   Period Weeks   Status Unable to assess   PT LONG TERM GOAL #3   Title He will increase R knee AROM to 0 - 125 degrees to assist with functional and efficient gait pattern (10/30/2014)   Baseline 130   Time 6   Period Weeks   Status Achieved   PT LONG TERM GOAL #4   Title pt will be able to walking/stand for > 30  min and navigate >10 steps with < 2/10 pain to assist with community ambulaiton activities (10/30/2014)   Baseline steps one at a time.   Time 6   Period Weeks   Status On-going   PT LONG TERM GOAL #5   Title He will increase FOTO score to > 55 to demontrate improved functional capacity at discharge (10/30/2014)   Time 6   Period Weeks   Status Unable to assess               Plan - 11/13/14 1544    Clinical Impression Statement 130 AA knee flexion after self mobilization for flexion with towel.  Fatigues quickly.  Steps at home goes 1 at a time.  balance a challange   PT Next Visit Plan progress CKC strengthening, balance training, modalities PRN  FOTO next, Needs renewal.  POC 11/09/14   Consulted and Agree with Plan of Care Patient        Problem List Patient Active Problem List   Diagnosis Date Noted  . S/P total knee arthroplasty 09/10/2011  . Tobacco abuse 09/10/2011  . DM type 2, uncontrolled, with neuropathy ([place]) 09/10/2011  . HTN (hypertension) 09/10/2011  . Hepatitis C 09/10/2011    Timothy Lane 11/13/2014, 3:51 PM  [facility] [address] [place], [place], [address] Phone: [phone]   Fax:  [phone]     Melvenia Needles, PTA 11/13/2014 3:51 PM Phone: [phone] Fax: [phone]

## 2014-11-13 NOTE — Patient Instructions (Signed)
Towel used at knee for self mobilization for knee flexion.

## 2014-11-15 ENCOUNTER — Ambulatory Visit: Payer: No Typology Code available for payment source | Admitting: Physical Therapy

## 2014-11-20 ENCOUNTER — Ambulatory Visit: Payer: No Typology Code available for payment source | Admitting: Physical Therapy

## 2014-11-20 DIAGNOSIS — R269 Unspecified abnormalities of gait and mobility: Secondary | ICD-10-CM

## 2014-11-20 DIAGNOSIS — M25561 Pain in right knee: Secondary | ICD-10-CM

## 2014-11-20 DIAGNOSIS — R29898 Other symptoms and signs involving the musculoskeletal system: Secondary | ICD-10-CM | POA: Diagnosis not present

## 2014-11-20 DIAGNOSIS — M25661 Stiffness of right knee, not elsewhere classified: Secondary | ICD-10-CM
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
## 2014-11-22 ENCOUNTER — Ambulatory Visit: Payer: No Typology Code available for payment source | Admitting: Physical Therapy

## 2014-11-27 ENCOUNTER — Ambulatory Visit: Payer: No Typology Code available for payment source | Admitting: Physical Therapy

## 2014-11-27 DIAGNOSIS — R29898 Other symptoms and signs involving the musculoskeletal system: Secondary | ICD-10-CM

## 2014-11-27 DIAGNOSIS — M25661 Stiffness of right knee, not elsewhere classified: Secondary | ICD-10-CM

## 2014-11-27 DIAGNOSIS — R269 Unspecified abnormalities of gait and mobility: Secondary | ICD-10-CM

## 2014-11-27 DIAGNOSIS — M25561 Pain in right knee: Secondary | ICD-10-CM

## 2014-11-27 NOTE — Therapy (Signed)
[place], [place], [address] Phone: [phone]   Fax:  [phone]  Physical Therapy Treatment  Patient Details  Name: Timothy Lane MRN: [id number] Date of Birth: 1950/11/17 No Data Recorded  Encounter Date: 11/27/2014      PT End of Session - 11/27/14 1538    Visit Number 11   Number of Visits 16   Date for PT Re-Evaluation 12/11/14   PT Start Time 1500   PT Stop Time 1525   PT Time Calculation (min) 25 min   Activity Tolerance Other (comment)  not feeloing well   Behavior During Therapy [facility] for tasks assessed/performed      Past Medical History  Diagnosis Date  . Hypertension   . Bronchitis   . Asthma   . Diabetes mellitus   . Stomach ulcer   . Arthritis   . Hepatitis     hepatitis C    Past Surgical History  Procedure Laterality Date  . Knee arthroscopy      left knee 2 surgeries  . Total knee arthroplasty  09/08/2011    Procedure: TOTAL KNEE ARTHROPLASTY;  Surgeon: Rudean Haskell, MD;  Location: [place];  Service: Orthopedics;  Laterality: Left;  left total knee arthroplasty    There were no vitals filed for this visit.  Visit Diagnosis:  Weakness of right leg  Decreased ROM of right knee  Right knee pain  Abnormality of gait      Subjective Assessment - 11/27/14 1534    Aggravating Factors  does not know very intermittant.   Multiple Pain Sites No                         [facility] Adult PT Treatment/Exercise - 11/27/14 1500    Knee/Hip Exercises: Machines for Strengthening   Cybex Knee Extension 4 plates   Cybex Knee Flexion 4 plates, 5 plates 20 + each                   PT Short Term Goals - 10/19/14 1423    PT SHORT TERM GOAL #1   Title Pt will be I with initial HEP (10/09/2014)   Time 3   Period Weeks   Status Achieved   PT SHORT TERM GOAL #2   Title He will increase his FOTO score by > 10 points to assist with increased functional  capacity (10/09/2014)   Time 3   Period Weeks   Status Achieved           PT Long Term Goals - 11/27/14 1539    PT LONG TERM GOAL #1   Title upon discharge pt will be I with all HEP given throughout HEP (10/30/2014)   Time 6   Period Weeks   Status On-going   PT LONG TERM GOAL #2   Title He will increaes RLE strength to > 4+/5 to assist with endurance for prolong walking/standing activities (10/30/2014)   Baseline at least 4/5   Time 6   Period Weeks   Status Unable to assess   PT LONG TERM GOAL #3   Title He will increase R knee AROM to 0 - 125 degrees to assist with functional and efficient gait pattern (10/30/2014)   Time 6   Period Weeks   Status Achieved   PT LONG TERM GOAL #4   Title pt will be able to walking/stand for > 30 min and navigate >10 steps with <  2/10 pain to assist with community ambulaiton activities (10/30/2014)   Time 6   Period Weeks   Status Unable to assess   PT LONG TERM GOAL #5   Title He will increase FOTO score to > 55 to demontrate improved functional capacity at discharge (10/30/2014)   Time 6   Period Weeks   Status Unable to assess               Plan - 11/27/14 1539    Clinical Impression Statement short session, patient not feeling well.   PT Next Visit Plan progress CKC strengthening, balance training, modalities PRN     Consulted and Agree with Plan of Care Patient        Problem List Patient Active Problem List   Diagnosis Date Noted  . S/P total knee arthroplasty 09/10/2011  . Tobacco abuse 09/10/2011  . DM type 2, uncontrolled, with neuropathy ([place]) 09/10/2011  . HTN (hypertension) 09/10/2011  . Hepatitis C 09/10/2011    Alma Mohiuddin 11/27/2014, 3:41 PM  [facility] [address] [place], [place], [address] Phone: [phone]   Fax:  [phone]  Name: Timothy Lane MRN: [id number] Date of Birth: January 06, 1951    Melvenia Needles, PTA 11/27/2014 3:41 PM Phone:  [phone] Fax: [phone]

## 2014-11-29 ENCOUNTER — Encounter: Payer: Self-pay | Admitting: Physical Therapy

## 2014-12-04 ENCOUNTER — Ambulatory Visit: Payer: No Typology Code available for payment source | Admitting: Physical Therapy

## 2014-12-04 DIAGNOSIS — R29898 Other symptoms and signs involving the musculoskeletal system: Secondary | ICD-10-CM | POA: Diagnosis not present

## 2014-12-04 DIAGNOSIS — R269 Unspecified abnormalities of gait and mobility: Secondary | ICD-10-CM

## 2014-12-04 DIAGNOSIS — M25661 Stiffness of right knee, not elsewhere classified: Secondary | ICD-10-CM

## 2014-12-04 DIAGNOSIS — M25561 Pain in right knee: Secondary | ICD-10-CM

## 2014-12-04 NOTE — Patient Instructions (Signed)
   Sherral Dirocco PT, DPT, LAT, ATC  [facility] Phone: [phone]     

## 2014-12-04 NOTE — Therapy (Signed)
[place], [place], [address] Phone: [phone]   Fax:  [phone]  Physical Therapy Treatment / discharge  Patient Details  Name: Timothy Lane MRN: [id number] Date of Birth: 1950/08/29 No Data Recorded  Encounter Date: 12/04/2014      PT End of Session - 12/04/14 1528    Visit Number 12   Number of Visits 16   Date for PT Re-Evaluation 12/11/14   PT Start Time 1500   PT Stop Time 1530   PT Time Calculation (min) 30 min   Activity Tolerance Patient tolerated treatment well   Behavior During Therapy [facility] for tasks assessed/performed      Past Medical History  Diagnosis Date  . Hypertension   . Bronchitis   . Asthma   . Diabetes mellitus   . Stomach ulcer   . Arthritis   . Hepatitis     hepatitis C    Past Surgical History  Procedure Laterality Date  . Knee arthroscopy      left knee 2 surgeries  . Total knee arthroplasty  09/08/2011    Procedure: TOTAL KNEE ARTHROPLASTY;  Surgeon: Rudean Haskell, MD;  Location: [place];  Service: Orthopedics;  Laterality: Left;  left total knee arthroplasty    There were no vitals filed for this visit.  Visit Diagnosis:  Weakness of right leg  Decreased ROM of right knee  Right knee pain  Abnormality of gait      Subjective Assessment - 12/04/14 1504    Subjective "I feel like I am getting better and may not need any more therapy"    Currently in Pain? Yes   Pain Score 1    Pain Location Knee   Pain Orientation Right;Lateral   Pain Type Surgical pain   Pain Onset More than a month ago   Pain Frequency Constant            [facility] PT Assessment - 12/04/14 0001    Observation/Other Assessments   Focus on Therapeutic Outcomes (FOTO)  53% limited   AROM   Right Knee Extension 0   Right Knee Flexion 127   Strength   Right Knee Flexion 5/5   Right Knee Extension 5/5                     [facility] Adult PT Treatment/Exercise -  12/04/14 1507    Lumbar Exercises: Aerobic   Stationary Bike L3 x 10 min                PT Education - 12/04/14 1527    Education provided Yes   Education Details updated HEP   Person(s) Educated Patient   Methods Explanation   Comprehension Verbalized understanding          PT Short Term Goals - 12/04/14 1519    PT SHORT TERM GOAL #1   Title Pt will be I with initial HEP (10/09/2014)   Time 3   Period Weeks   Status Achieved   PT SHORT TERM GOAL #2   Title He will increase his FOTO score by > 10 points to assist with increased functional capacity (10/09/2014)   Time 3   Period Weeks   Status Achieved           PT Long Term Goals - 12/04/14 1519    PT LONG TERM GOAL #1   Title upon discharge pt will be I with all HEP given throughout HEP (10/30/2014)  Period Weeks   Status Achieved   PT LONG TERM GOAL #2   Title He will increaes RLE strength to > 4+/5 to assist with endurance for prolong walking/standing activities (10/30/2014)   Baseline at least 4/5   Time 6   Period Weeks   Status Achieved   PT LONG TERM GOAL #3   Title He will increase R knee AROM to 0 - 125 degrees to assist with functional and efficient gait pattern (10/30/2014)   Baseline 130   Time 6   Period Weeks   Status Achieved   PT LONG TERM GOAL #4   Title pt will be able to walking/stand for > 30 min and navigate >10 steps with < 2/10 pain to assist with community ambulaiton activities (10/30/2014)   Baseline steps one at a time.   Time 6   Period Weeks   Status Achieved   PT LONG TERM GOAL #5   Title He will increase FOTO score to > 55 to demontrate improved functional capacity at discharge (10/30/2014)   Time 6   Period Weeks   Status Partially Met               Plan - 2014/12/08 1528    Clinical Impression Statement [name] reports to therapy with minimal pain noted in the R knee but rpeorts that he has been doing well. pt increased his R knee AROM and strength as well as  increased his FOTO score and has met all goals.  He states that he feels like he can be discharged today and can maintain and progress his current level function independenlty and no longer requires physical therapy.    PT Next Visit Plan d/C   PT Home Exercise Plan standing hip extension/ abduction    Consulted and Agree with Plan of Care Patient          G-Codes - 12/08/14 1531    Functional Assessment Tool Used FOTO 53% limited    Functional Limitation Mobility: Walking and moving around   Mobility: Walking and Moving Around Goal Status [phone]) At least 40 percent but less than 60 percent impaired, limited or restricted   Mobility: Walking and Moving Around Discharge Status [phone]) At least 40 percent but less than 60 percent impaired, limited or restricted      Problem List Patient Active Problem List   Diagnosis Date Noted  . S/P total knee arthroplasty 09/10/2011  . Tobacco abuse 09/10/2011  . DM type 2, uncontrolled, with neuropathy ([place]) 09/10/2011  . HTN (hypertension) 09/10/2011  . Hepatitis C 09/10/2011   Starr Lake PT, DPT, LAT, ATC  December 08, 2014  3:32 PM    [facility] [address] [place], [place], [address] Phone: [phone]   Fax:  [phone]  Name: Timothy Lane MRN: [id number] Date of Birth: 1950-07-14    PHYSICAL THERAPY DISCHARGE SUMMARY  Visits from Start of Care: 12   Current functional level related to goals / functional outcomes: FOTO 53% limited   Remaining deficits: Intermittent mild pain in the knee.   Education / Equipment: HEP, thereaband for strengthening.   Plan: Patient agrees to discharge.  Patient goals were met. Patient is being discharged due to meeting the stated rehab goals.  ?????         Kristoffer Leamon PT, DPT, LAT, ATC  12/08/2014  3:33 PM

## 2014-12-06 ENCOUNTER — Encounter: Payer: Self-pay | Admitting: Physical Therapy

## 2014-12-11 ENCOUNTER — Encounter: Payer: Self-pay | Admitting: Physical Therapy

## 2020-09-10 NOTE — Progress Notes (Signed)
[facility] [place]  ASSESSMENT / PLAN: 70 y.o. male with end-stage renal disease dialyzing Monday, Wednesday, and Friday via left upper extremity arteriovenous graft.  He has 2 areas of pseudoaneurysmal degeneration which have caused ulceration of his skin.  He has a scab about the superiormost area.  He needs prompt revision.  I counseled him that may need a tunneled dialysis catheter placed.  We will get him dialysis tomorrow and have him revised on Thursday with my partner Dr. Trula Slade.  CHIEF COMPLAINT: ulcerated arteriovenous graft  HISTORY OF PRESENT ILLNESS: Timothy Lane is a 70 y.o. male with end-stage renal disease dialyzing Monday, Wednesday, and Friday, who is referred to clinic from the [place] for evaluation of ulceration overlying his left upper extremity arteriovenous graft.  His graft has been functioning well for him for about 3 years.  He has noticed thin skin and scabs developing over his fistula over the past several weeks.  He has not had a bleeding episode from his graft.    Past Medical History:  Diagnosis Date   Arthritis    Asthma    Bronchitis    Chronic kidney disease    Diabetes mellitus    Hepatitis    hepatitis C   Hypertension    Stomach ulcer     Past Surgical History:  Procedure Laterality Date   ARTERIOVENOUS GRAFT PLACEMENT     AV FISTULA PLACEMENT     KNEE ARTHROSCOPY     left knee 2 surgeries   TOTAL KNEE ARTHROPLASTY  09/08/2011   Procedure: TOTAL KNEE ARTHROPLASTY;  Surgeon: Rudean Haskell, MD;  Location: [facility];  Service: Orthopedics;  Laterality: Left;  left total knee arthroplasty    History reviewed. No pertinent family history.  Social History   Socioeconomic History   Marital status: Married    Spouse name: Not on file   Number of children: Not on file   Years of education: Not on file   Highest education level: Not on file  Occupational History   Not on file  Tobacco Use   Smoking status: Former     Years: 50.00    Types: Cigarettes   Smokeless tobacco: Never   Tobacco comments:    smokes only 2 cigs per day  Vaping Use   Vaping Use: Never used  Substance and Sexual Activity   Alcohol use: No   Drug use: Yes    Types: Cocaine, Marijuana, "Crack" cocaine, Heroin, LSD, Methamphetamines   Sexual activity: Not on file  Other Topics Concern   Not on file  Social History Narrative   Not on file   Social Determinants of Health   Financial Resource Strain: Not on file  Food Insecurity: Not on file  Transportation Needs: Not on file  Physical Activity: Not on file  Stress: Not on file  Social Connections: Not on file  Intimate Partner Violence: Not on file    No Known Allergies  Current Outpatient Medications  Medication Sig Dispense Refill   allopurinol (ZYLOPRIM) 100 MG tablet Take 200 mg by mouth daily.     calcitRIOL (ROCALTROL) 0.25 MCG capsule Take by mouth.     famotidine (PEPCID) 20 MG tablet TAKE ONE-HALF TABLET BY MOUTH EVERY OTHER DAY AT BEDTIME.     finasteride (PROSCAR) 5 MG tablet Take 1 tablet by mouth daily.     folic acid-vitamin b complex-vitamin c-selenium-zinc (DIALYVITE) 3 MG TABS tablet Take 1 tablet by mouth daily.  furosemide (LASIX) 80 MG tablet TAKE ONE TABLET BY MOUTH EVERY OTHER DAY TAKE ON NONDIALYSIS DAYS MORNINGS     gabapentin (NEURONTIN) 100 MG capsule Take 300 mg by mouth 3 (three) times daily.      hydrochlorothiazide (HYDRODIURIL) 25 MG tablet Take 25 mg by mouth daily.     insulin NPH-insulin regular (NOVOLIN 70/30) (70-30) 100 UNIT/ML injection Inject 35 Units into the skin 2 (two) times daily. 35 units in the morning 25 units at night     lisinopril (PRINIVIL,ZESTRIL) 40 MG tablet Take 20 mg by mouth daily.      meloxicam (MOBIC) 15 MG tablet Take 15 mg by mouth daily as needed for pain.     metoprolol tartrate (LOPRESSOR) 50 MG tablet TAKE ONE TABLET BY MOUTH TWICE A DAY FOR HEART     triamcinolone cream (KENALOG) 0.1 % Apply 1  application topically 2 (two) times daily.     No current facility-administered medications for this visit.    REVIEW OF SYSTEMS:  '[X]'$  denotes positive finding, '[ ]'$  denotes negative finding Cardiac  Comments:  Chest pain or chest pressure:    Shortness of breath upon exertion:    Short of breath when lying flat:    Irregular heart rhythm:        Vascular    Pain in calf, thigh, or hip brought on by ambulation:    Pain in feet at night that wakes you up from your sleep:     Blood clot in your veins:    Leg swelling:         Pulmonary    Oxygen at home:    Productive cough:     Wheezing:         Neurologic    Sudden weakness in arms or legs:     Sudden numbness in arms or legs:     Sudden onset of difficulty speaking or slurred speech:    Temporary loss of vision in one eye:     Problems with dizziness:         Gastrointestinal    Blood in stool:     Vomited blood:         Genitourinary    Burning when urinating:     Blood in urine:        Psychiatric    Major depression:         Hematologic    Bleeding problems:    Problems with blood clotting too easily:        Skin    Rashes or ulcers:        Constitutional    Fever or chills:      PHYSICAL EXAM Vitals:   09/11/20 1025  BP: (!) 101/55  Pulse: (!) 51  Resp: 20  Temp: 98 F (36.7 C)  SpO2: 97%  Weight: 208 lb (94.3 kg)  Height: '6\' 1"'$  (1.854 m)    Constitutional: well appearing. no distress. Appears well nourished.  Neurologic: CN intact. no focal findings. no sensory loss. Psychiatric:  Mood and affect symmetric and appropriate. Eyes:  No icterus. No conjunctival pallor. Ears, nose, throat:  mucous membranes moist. Midline trachea.  Cardiac: regular rate and rhythm.  Respiratory:  unlabored. Abdominal:  soft, non-tender, non-distended.  Peripheral vascular: 2+ radial pulses. LUE AVG with two areas of pseudoaneurysm. Overlaying the pseudoaneurysms are areas of hypopigmentation. The upper  pseudoaneurysm has a worrisome appearing eschar. Extremity: no edema. no cyanosis. no pallor.  Skin: no gangrene. no  ulceration.  Lymphatic: no Stemmer's sign. no palpable lymphadenopathy.  PERTINENT LABORATORY AND RADIOLOGIC DATA  Most recent CBC CBC Latest Ref Rng & Units 09/11/2011 09/10/2011 09/09/2011  WBC 4.0 - 10.5 K/uL 4.4 5.6 3.4(L)  Hemoglobin 13.0 - 17.0 g/dL 7.0(L) 8.7(L) 7.2(L)  Hematocrit 39.0 - 52.0 % 19.7(L) 24.8(L) 20.9(L)  Platelets 150 - 400 K/uL 84(L) 97(L) 106(L)     Most recent CMP CMP Latest Ref Rng & Units 09/11/2011 09/10/2011 09/10/2011  Glucose 70 - 99 mg/dL 191(H) 489(H) 456(H)  BUN 6 - 23 mg/dL 28(H) - 24(H)  Creatinine 0.50 - 1.35 mg/dL 1.24 - 1.26  Sodium 135 - 145 mEq/L 136 - 135  Potassium 3.5 - 5.1 mEq/L 4.1 - 4.2  Chloride 96 - 112 mEq/L 99 - 95(L)  CO2 19 - 32 mEq/L 30 - 30  Calcium 8.4 - 10.5 mg/dL 8.2(L) - 8.3(L)  Total Protein 6.0 - 8.3 g/dL - - -  Total Bilirubin 0.3 - 1.2 mg/dL - - -  Alkaline Phos 39 - 117 U/L - - -  AST 0 - 37 U/L - - -  ALT 0 - 53 U/L - - -    Renal function CrCl cannot be calculated (Patient's most recent lab result is older than the maximum 21 days allowed.).  Hgb A1c MFr Bld (%)  Date Value  09/08/2011 7.2 (H)    [name]. Stanford Breed, MD [facility] Phone Number: [phone] 09/11/2020 1:45 PM

## 2020-09-10 NOTE — H&P (View-Only) (Signed)
[facility] [place]  ASSESSMENT / PLAN: 70 y.o. male with end-stage renal disease dialyzing Monday, Wednesday, and Friday via left upper extremity arteriovenous graft.  He has 2 areas of pseudoaneurysmal degeneration which have caused ulceration of his skin.  He has a scab about the superiormost area.  He needs prompt revision.  I counseled him that may need a tunneled dialysis catheter placed.  We will get him dialysis tomorrow and have him revised on Thursday with my partner Dr. Trula Slade.  CHIEF COMPLAINT: ulcerated arteriovenous graft  HISTORY OF PRESENT ILLNESS: Timothy Lane is a 70 y.o. male with end-stage renal disease dialyzing Monday, Wednesday, and Friday, who is referred to clinic from the [place] for evaluation of ulceration overlying his left upper extremity arteriovenous graft.  His graft has been functioning well for him for about 3 years.  He has noticed thin skin and scabs developing over his fistula over the past several weeks.  He has not had a bleeding episode from his graft.    Past Medical History:  Diagnosis Date   Arthritis    Asthma    Bronchitis    Chronic kidney disease    Diabetes mellitus    Hepatitis    hepatitis C   Hypertension    Stomach ulcer     Past Surgical History:  Procedure Laterality Date   ARTERIOVENOUS GRAFT PLACEMENT     AV FISTULA PLACEMENT     KNEE ARTHROSCOPY     left knee 2 surgeries   TOTAL KNEE ARTHROPLASTY  09/08/2011   Procedure: TOTAL KNEE ARTHROPLASTY;  Surgeon: Rudean Haskell, MD;  Location: [facility];  Service: Orthopedics;  Laterality: Left;  left total knee arthroplasty    History reviewed. No pertinent family history.  Social History   Socioeconomic History   Marital status: Married    Spouse name: Not on file   Number of children: Not on file   Years of education: Not on file   Highest education level: Not on file  Occupational History   Not on file  Tobacco Use   Smoking status: Former     Years: 50.00    Types: Cigarettes   Smokeless tobacco: Never   Tobacco comments:    smokes only 2 cigs per day  Vaping Use   Vaping Use: Never used  Substance and Sexual Activity   Alcohol use: No   Drug use: Yes    Types: Cocaine, Marijuana, "Crack" cocaine, Heroin, LSD, Methamphetamines   Sexual activity: Not on file  Other Topics Concern   Not on file  Social History Narrative   Not on file   Social Determinants of Health   Financial Resource Strain: Not on file  Food Insecurity: Not on file  Transportation Needs: Not on file  Physical Activity: Not on file  Stress: Not on file  Social Connections: Not on file  Intimate Partner Violence: Not on file    No Known Allergies  Current Outpatient Medications  Medication Sig Dispense Refill   allopurinol (ZYLOPRIM) 100 MG tablet Take 200 mg by mouth daily.     calcitRIOL (ROCALTROL) 0.25 MCG capsule Take by mouth.     famotidine (PEPCID) 20 MG tablet TAKE ONE-HALF TABLET BY MOUTH EVERY OTHER DAY AT BEDTIME.     finasteride (PROSCAR) 5 MG tablet Take 1 tablet by mouth daily.     folic acid-vitamin b complex-vitamin c-selenium-zinc (DIALYVITE) 3 MG TABS tablet Take 1 tablet by mouth daily.  furosemide (LASIX) 80 MG tablet TAKE ONE TABLET BY MOUTH EVERY OTHER DAY TAKE ON NONDIALYSIS DAYS MORNINGS     gabapentin (NEURONTIN) 100 MG capsule Take 300 mg by mouth 3 (three) times daily.      hydrochlorothiazide (HYDRODIURIL) 25 MG tablet Take 25 mg by mouth daily.     insulin NPH-insulin regular (NOVOLIN 70/30) (70-30) 100 UNIT/ML injection Inject 35 Units into the skin 2 (two) times daily. 35 units in the morning 25 units at night     lisinopril (PRINIVIL,ZESTRIL) 40 MG tablet Take 20 mg by mouth daily.      meloxicam (MOBIC) 15 MG tablet Take 15 mg by mouth daily as needed for pain.     metoprolol tartrate (LOPRESSOR) 50 MG tablet TAKE ONE TABLET BY MOUTH TWICE A DAY FOR HEART     triamcinolone cream (KENALOG) 0.1 % Apply 1  application topically 2 (two) times daily.     No current facility-administered medications for this visit.    REVIEW OF SYSTEMS:  '[X]'$  denotes positive finding, '[ ]'$  denotes negative finding Cardiac  Comments:  Chest pain or chest pressure:    Shortness of breath upon exertion:    Short of breath when lying flat:    Irregular heart rhythm:        Vascular    Pain in calf, thigh, or hip brought on by ambulation:    Pain in feet at night that wakes you up from your sleep:     Blood clot in your veins:    Leg swelling:         Pulmonary    Oxygen at home:    Productive cough:     Wheezing:         Neurologic    Sudden weakness in arms or legs:     Sudden numbness in arms or legs:     Sudden onset of difficulty speaking or slurred speech:    Temporary loss of vision in one eye:     Problems with dizziness:         Gastrointestinal    Blood in stool:     Vomited blood:         Genitourinary    Burning when urinating:     Blood in urine:        Psychiatric    Major depression:         Hematologic    Bleeding problems:    Problems with blood clotting too easily:        Skin    Rashes or ulcers:        Constitutional    Fever or chills:      PHYSICAL EXAM Vitals:   09/11/20 1025  BP: (!) 101/55  Pulse: (!) 51  Resp: 20  Temp: 98 F (36.7 C)  SpO2: 97%  Weight: 208 lb (94.3 kg)  Height: '6\' 1"'$  (1.854 m)    Constitutional: well appearing. no distress. Appears well nourished.  Neurologic: CN intact. no focal findings. no sensory loss. Psychiatric:  Mood and affect symmetric and appropriate. Eyes:  No icterus. No conjunctival pallor. Ears, nose, throat:  mucous membranes moist. Midline trachea.  Cardiac: regular rate and rhythm.  Respiratory:  unlabored. Abdominal:  soft, non-tender, non-distended.  Peripheral vascular: 2+ radial pulses. LUE AVG with two areas of pseudoaneurysm. Overlaying the pseudoaneurysms are areas of hypopigmentation. The upper  pseudoaneurysm has a worrisome appearing eschar. Extremity: no edema. no cyanosis. no pallor.  Skin: no gangrene. no  ulceration.  Lymphatic: no Stemmer's sign. no palpable lymphadenopathy.  PERTINENT LABORATORY AND RADIOLOGIC DATA  Most recent CBC CBC Latest Ref Rng & Units 09/11/2011 09/10/2011 09/09/2011  WBC 4.0 - 10.5 K/uL 4.4 5.6 3.4(L)  Hemoglobin 13.0 - 17.0 g/dL 7.0(L) 8.7(L) 7.2(L)  Hematocrit 39.0 - 52.0 % 19.7(L) 24.8(L) 20.9(L)  Platelets 150 - 400 K/uL 84(L) 97(L) 106(L)     Most recent CMP CMP Latest Ref Rng & Units 09/11/2011 09/10/2011 09/10/2011  Glucose 70 - 99 mg/dL 191(H) 489(H) 456(H)  BUN 6 - 23 mg/dL 28(H) - 24(H)  Creatinine 0.50 - 1.35 mg/dL 1.24 - 1.26  Sodium 135 - 145 mEq/L 136 - 135  Potassium 3.5 - 5.1 mEq/L 4.1 - 4.2  Chloride 96 - 112 mEq/L 99 - 95(L)  CO2 19 - 32 mEq/L 30 - 30  Calcium 8.4 - 10.5 mg/dL 8.2(L) - 8.3(L)  Total Protein 6.0 - 8.3 g/dL - - -  Total Bilirubin 0.3 - 1.2 mg/dL - - -  Alkaline Phos 39 - 117 U/L - - -  AST 0 - 37 U/L - - -  ALT 0 - 53 U/L - - -    Renal function CrCl cannot be calculated (Patient's most recent lab result is older than the maximum 21 days allowed.).  Hgb A1c MFr Bld (%)  Date Value  09/08/2011 7.2 (H)    [name]. Stanford Breed, MD [facility] Phone Number: [phone] 09/11/2020 1:45 PM

## 2020-09-11 ENCOUNTER — Ambulatory Visit (INDEPENDENT_AMBULATORY_CARE_PROVIDER_SITE_OTHER): Payer: No Typology Code available for payment source | Admitting: Vascular Surgery

## 2020-09-11 ENCOUNTER — Other Ambulatory Visit: Payer: Self-pay

## 2020-09-11 ENCOUNTER — Encounter: Payer: Self-pay | Admitting: Vascular Surgery

## 2020-09-11 VITALS — BP 101/55 | HR 51 | Temp 98.0°F | Resp 20 | Ht 73.0 in | Wt 208.0 lb

## 2020-09-11 DIAGNOSIS — N186 End stage renal disease: Secondary | ICD-10-CM | POA: Diagnosis not present

## 2020-09-11 DIAGNOSIS — Z992 Dependence on renal dialysis: Secondary | ICD-10-CM | POA: Diagnosis not present

## 2020-09-12 NOTE — Anesthesia Preprocedure Evaluation (Addendum)
Anesthesia Evaluation  Patient identified by MRN, date of birth, ID band Patient awake    Reviewed: Allergy & Precautions, NPO status , Patient's Chart, lab work & pertinent test results, reviewed documented beta blocker date and time   Airway Mallampati: III  TM Distance: >3 FB Neck ROM: Full    Dental  (+) Edentulous Upper, Dental Advisory Given, Missing, Poor Dentition,    Pulmonary asthma (no inhalers) , former smoker,    Pulmonary exam normal breath sounds clear to auscultation       Cardiovascular hypertension, Pt. on home beta blockers and Pt. 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)  cocaine use, marijuana use, methamphetamine use and IV drug use, Hepatitis - (treated several years ago), C  Endo/Other  diabetes, Well Controlled, Type 2, Insulin Dependenta1c 7.2, FS in preop 121  Renal/GU ESRF and DialysisRenal disease (MWF)Last HD yesterday, K 4.1  negative genitourinary   Musculoskeletal  (+) Arthritis , Osteoarthritis,  narcotic dependent  Abdominal   Peds  Hematology negative hematology ROS (+)   Anesthesia Other Findings   Reproductive/Obstetrics negative OB ROS                            Anesthesia Physical Anesthesia Plan  ASA: 3  Anesthesia Plan: General   Post-op Pain Management:    Induction: Intravenous  PONV Risk Score and Plan: 2 and Ondansetron, Dexamethasone and Midazolam  Airway Management Planned: LMA  Additional Equipment:   Intra-op Plan:   Post-operative Plan: Extubation in OR  Informed Consent: I have reviewed the patients History and Physical, chart, labs and discussed the procedure including the risks, benefits and alternatives for the  proposed anesthesia with the patient or authorized representative who has indicated his/her understanding and acceptance.     Dental advisory given  Plan Discussed with: CRNA  Anesthesia Plan Comments:         Anesthesia Quick Evaluation

## 2020-09-12 NOTE — Progress Notes (Signed)
Pt could not be reached. LVM to arrive 0620 for surgery, NPO after midnight, please take allopurinol, proscar, gabapentin, metoprolol morning of surgery. Check CBG as soon as they wake up in the morning and every 2 hours until arrival at the hospital. Novolin 70/30 please take 17.5 units tonight, none morning of surgery. Call [phone] if there are any further questions.

## 2020-09-13 ENCOUNTER — Encounter (HOSPITAL_COMMUNITY): Payer: Self-pay | Admitting: Vascular Surgery

## 2020-09-13 ENCOUNTER — Ambulatory Visit (HOSPITAL_COMMUNITY): Payer: No Typology Code available for payment source | Admitting: Anesthesiology

## 2020-09-13 ENCOUNTER — Ambulatory Visit (HOSPITAL_COMMUNITY): Payer: No Typology Code available for payment source

## 2020-09-13 ENCOUNTER — Other Ambulatory Visit: Payer: Self-pay

## 2020-09-13 ENCOUNTER — Encounter (HOSPITAL_COMMUNITY)
Admission: RE | Disposition: A | Discharge status: Home / Self Care | Payer: Self-pay | Source: Home / Self Care | Attending: Vascular Surgery

## 2020-09-13 ENCOUNTER — Ambulatory Visit (HOSPITAL_COMMUNITY)
Admission: RE | Admit: 2020-09-13 | Discharge: 2020-09-13 | Disposition: A | Discharge status: Home / Self Care | Payer: No Typology Code available for payment source | Attending: Vascular Surgery | Admitting: Vascular Surgery

## 2020-09-13 DIAGNOSIS — I120 Hypertensive chronic kidney disease with stage 5 chronic kidney disease or end stage renal disease: Secondary | ICD-10-CM | POA: Diagnosis not present

## 2020-09-13 DIAGNOSIS — F17210 Nicotine dependence, cigarettes, uncomplicated: Secondary | ICD-10-CM | POA: Insufficient documentation

## 2020-09-13 DIAGNOSIS — Z20822 Contact with and (suspected) exposure to covid-19: Secondary | ICD-10-CM | POA: Diagnosis not present

## 2020-09-13 DIAGNOSIS — Z992 Dependence on renal dialysis: Secondary | ICD-10-CM | POA: Diagnosis not present

## 2020-09-13 DIAGNOSIS — Z79899 Other long term (current) drug therapy: Secondary | ICD-10-CM | POA: Insufficient documentation

## 2020-09-13 DIAGNOSIS — T82510A Breakdown (mechanical) of surgically created arteriovenous fistula, initial encounter: Secondary | ICD-10-CM | POA: Insufficient documentation

## 2020-09-13 DIAGNOSIS — Z419 Encounter for procedure for purposes other than remedying health state, unspecified: Secondary | ICD-10-CM

## 2020-09-13 DIAGNOSIS — N186 End stage renal disease: Secondary | ICD-10-CM

## 2020-09-13 DIAGNOSIS — Z794 Long term (current) use of insulin: Secondary | ICD-10-CM | POA: Diagnosis not present

## 2020-09-13 DIAGNOSIS — E1122 Type 2 diabetes mellitus with diabetic chronic kidney disease: Secondary | ICD-10-CM | POA: Diagnosis not present

## 2020-09-13 DIAGNOSIS — Y832 Surgical operation with anastomosis, bypass or graft as the cause of abnormal reaction of the patient, or of later complication, without mention of misadventure at the time of the procedure: Secondary | ICD-10-CM

## 2020-09-13 DIAGNOSIS — Y841 Kidney dialysis as the cause of abnormal reaction of the patient, or of later complication, without mention of misadventure at the time of the procedure: Secondary | ICD-10-CM | POA: Diagnosis not present

## 2020-09-13 HISTORY — PX: INSERTION OF DIALYSIS CATHETER: SHX1324

## 2020-09-13 HISTORY — PX: REVISON OF ARTERIOVENOUS FISTULA: SHX6074

## 2020-09-13 LAB — GLUCOSE, CAPILLARY
Glucose-Capillary: 120 mg/dL — ABNORMAL HIGH (ref 70–99)
Glucose-Capillary: 138 mg/dL — ABNORMAL HIGH (ref 70–99)

## 2020-09-13 LAB — POCT I-STAT, CHEM 8
BUN: 25 mg/dL — ABNORMAL HIGH (ref 8–23)
Calcium, Ion: 1.01 mmol/L — ABNORMAL LOW (ref 1.15–1.40)
Chloride: 98 mmol/L (ref 98–111)
Creatinine, Ser: 6.7 mg/dL — ABNORMAL HIGH (ref 0.61–1.24)
Glucose, Bld: 121 mg/dL — ABNORMAL HIGH (ref 70–99)
HCT: 35 % — ABNORMAL LOW (ref 39.0–52.0)
Hemoglobin: 11.9 g/dL — ABNORMAL LOW (ref 13.0–17.0)
Potassium: 4.1 mmol/L (ref 3.5–5.1)
Sodium: 137 mmol/L (ref 135–145)
TCO2: 29 mmol/L (ref 22–32)

## 2020-09-13 LAB — SARS CORONAVIRUS 2 BY RT PCR (HOSPITAL ORDER, PERFORMED IN ~~LOC~~ HOSPITAL LAB): SARS Coronavirus 2: NEGATIVE

## 2020-09-13 SURGERY — INSERTION OF DIALYSIS CATHETER
Anesthesia: General

## 2020-09-13 MED ORDER — FENTANYL CITRATE (PF) 100 MCG/2ML IJ SOLN
INTRAMUSCULAR | Status: DC | PRN
  Administered 2020-09-13 (×2): 50 ug via INTRAVENOUS

## 2020-09-13 MED ORDER — ONDANSETRON HCL 4 MG/2ML IJ SOLN
INTRAMUSCULAR | Status: AC
  Filled 2020-09-13: qty 2

## 2020-09-13 MED ORDER — CHLORHEXIDINE GLUCONATE 0.12 % MT SOLN
OROMUCOSAL | Status: AC
  Administered 2020-09-13: 15 mL
  Filled 2020-09-13: qty 15

## 2020-09-13 MED ORDER — OXYCODONE-ACETAMINOPHEN 5-325 MG PO TABS: 1.0000 | ORAL_TABLET | Freq: Four times a day (QID) | ORAL | 0 refills | Status: DC | PRN

## 2020-09-13 MED ORDER — LIDOCAINE-EPINEPHRINE (PF) 1 %-1:200000 IJ SOLN
INTRAMUSCULAR | Status: AC
  Filled 2020-09-13: qty 30

## 2020-09-13 MED ORDER — PROPOFOL 10 MG/ML IV BOLUS
INTRAVENOUS | Status: DC | PRN
  Administered 2020-09-13: 100 mg via INTRAVENOUS
  Administered 2020-09-13: 30 mg via INTRAVENOUS

## 2020-09-13 MED ORDER — HEPARIN SODIUM (PORCINE) 1000 UNIT/ML IJ SOLN
INTRAMUSCULAR | Status: DC | PRN
  Administered 2020-09-13: 3200 [IU]

## 2020-09-13 MED ORDER — FENTANYL CITRATE (PF) 250 MCG/5ML IJ SOLN
INTRAMUSCULAR | Status: AC
  Filled 2020-09-13: qty 5

## 2020-09-13 MED ORDER — CHLORHEXIDINE GLUCONATE 4 % EX LIQD: 60.0000 mL | Freq: Once | CUTANEOUS | Status: DC

## 2020-09-13 MED ORDER — MIDAZOLAM HCL 2 MG/2ML IJ SOLN
INTRAMUSCULAR | Status: AC
  Filled 2020-09-13: qty 2

## 2020-09-13 MED ORDER — CEFAZOLIN SODIUM-DEXTROSE 2-4 GM/100ML-% IV SOLN
2.0000 g | INTRAVENOUS | Status: AC
  Administered 2020-09-13: 2 g via INTRAVENOUS
  Filled 2020-09-13: qty 100

## 2020-09-13 MED ORDER — ONDANSETRON HCL 4 MG/2ML IJ SOLN
INTRAMUSCULAR | Status: DC | PRN
Start: 2020-09-13 — End: 2020-09-13
  Administered 2020-09-13: 4 mg via INTRAVENOUS

## 2020-09-13 MED ORDER — METOPROLOL TARTRATE 50 MG PO TABS
50.0000 mg | ORAL_TABLET | Freq: Once | ORAL | Status: AC
  Administered 2020-09-13: 50 mg via ORAL
  Filled 2020-09-13: qty 1

## 2020-09-13 MED ORDER — HEPARIN SODIUM (PORCINE) 1000 UNIT/ML IJ SOLN
INTRAMUSCULAR | Status: AC
  Filled 2020-09-13: qty 1

## 2020-09-13 MED ORDER — LIDOCAINE 2% (20 MG/ML) 5 ML SYRINGE
INTRAMUSCULAR | Status: DC | PRN
Start: 2020-09-13 — End: 2020-09-13
  Administered 2020-09-13: 40 mg via INTRAVENOUS

## 2020-09-13 MED ORDER — LIDOCAINE HCL (PF) 1 % IJ SOLN
INTRAMUSCULAR | Status: AC
  Filled 2020-09-13: qty 30

## 2020-09-13 MED ORDER — EPHEDRINE SULFATE-NACL 50-0.9 MG/10ML-% IV SOSY
PREFILLED_SYRINGE | INTRAVENOUS | Status: DC | PRN
  Administered 2020-09-13: 10 mg via INTRAVENOUS
  Administered 2020-09-13: 5 mg via INTRAVENOUS
  Administered 2020-09-13: 10 mg via INTRAVENOUS
  Administered 2020-09-13: 5 mg via INTRAVENOUS
  Administered 2020-09-13: 10 mg via INTRAVENOUS

## 2020-09-13 MED ORDER — 0.9 % SODIUM CHLORIDE (POUR BTL) OPTIME
TOPICAL | Status: DC | PRN
  Administered 2020-09-13: 1000 mL

## 2020-09-13 MED ORDER — HEPARIN 6000 UNIT IRRIGATION SOLUTION
Status: AC
  Filled 2020-09-13: qty 500

## 2020-09-13 MED ORDER — ACETAMINOPHEN 500 MG PO TABS
1000.0000 mg | ORAL_TABLET | Freq: Once | ORAL | Status: AC
  Administered 2020-09-13: 1000 mg via ORAL
  Filled 2020-09-13: qty 2

## 2020-09-13 MED ORDER — SODIUM CHLORIDE 0.9 % IV SOLN: INTRAVENOUS | Status: DC

## 2020-09-13 MED ORDER — DEXAMETHASONE SODIUM PHOSPHATE 10 MG/ML IJ SOLN
INTRAMUSCULAR | Status: AC
  Filled 2020-09-13: qty 1

## 2020-09-13 MED ORDER — PROPOFOL 10 MG/ML IV BOLUS
INTRAVENOUS | Status: AC
  Filled 2020-09-13: qty 20

## 2020-09-13 MED ORDER — HEPARIN 6000 UNIT IRRIGATION SOLUTION
Status: DC | PRN
  Administered 2020-09-13: 1

## 2020-09-13 MED ORDER — FENTANYL CITRATE (PF) 100 MCG/2ML IJ SOLN: 25.0000 ug | INTRAMUSCULAR | Status: DC | PRN

## 2020-09-13 MED ORDER — HEMOSTATIC AGENTS (NO CHARGE) OPTIME
TOPICAL | Status: DC | PRN
  Administered 2020-09-13: 1 via TOPICAL

## 2020-09-13 SURGICAL SUPPLY — 72 items
ADH SKN CLS APL DERMABOND .7 (GAUZE/BANDAGES/DRESSINGS) ×2
APL PRP STRL LF DISP 70% ISPRP (MISCELLANEOUS) ×2
APL SKNCLS STERI-STRIP NONHPOA (GAUZE/BANDAGES/DRESSINGS) ×2
ARMBAND PINK RESTRICT EXTREMIT (MISCELLANEOUS) ×3 IMPLANT
BAG COUNTER SPONGE SURGICOUNT (BAG) ×3 IMPLANT
BAG DECANTER FOR FLEXI CONT (MISCELLANEOUS) ×3 IMPLANT
BAG SPNG CNTER NS LX DISP (BAG) ×2
BENZOIN TINCTURE PRP APPL 2/3 (GAUZE/BANDAGES/DRESSINGS) ×3 IMPLANT
BIOPATCH BLUE 3/4IN DISK W/1.5 (GAUZE/BANDAGES/DRESSINGS) ×3 IMPLANT
BIOPATCH RED 1 DISK 7.0 (GAUZE/BANDAGES/DRESSINGS) ×3 IMPLANT
CANISTER SUCT 3000ML PPV (MISCELLANEOUS) ×3 IMPLANT
CANNULA VESSEL 3MM 2 BLNT TIP (CANNULA) ×3 IMPLANT
CATH PALINDROME RT-P 15FX19CM (CATHETERS) ×3 IMPLANT
CATH PALINDROME-P 19CM W/VT (CATHETERS) IMPLANT
CATH PALINDROME-P 23CM W/VT (CATHETERS) IMPLANT
CATH PALINDROME-P 28CM W/VT (CATHETERS) IMPLANT
CHLORAPREP W/TINT 26 (MISCELLANEOUS) ×3 IMPLANT
CLIP VESOCCLUDE MED 6/CT (CLIP) ×3 IMPLANT
CLIP VESOCCLUDE SM WIDE 6/CT (CLIP) ×3 IMPLANT
COVER PROBE W GEL 5X96 (DRAPES) ×6 IMPLANT
COVER SURGICAL LIGHT HANDLE (MISCELLANEOUS) ×3 IMPLANT
COVER ULTRASOUND PROBE 36 ST (MISCELLANEOUS) ×3 IMPLANT
DERMABOND ADVANCED (GAUZE/BANDAGES/DRESSINGS) ×1
DERMABOND ADVANCED .7 DNX12 (GAUZE/BANDAGES/DRESSINGS) ×2 IMPLANT
DRAPE C-ARM 42X72 X-RAY (DRAPES) ×3 IMPLANT
DRAPE CHEST BREAST 15X10 FENES (DRAPES) ×3 IMPLANT
DRAPE ORTHO SPLIT 77X108 STRL (DRAPES) ×3
DRAPE SURG ORHT 6 SPLT 77X108 (DRAPES) ×2 IMPLANT
DRSG TEGADERM 4X4.75 (GAUZE/BANDAGES/DRESSINGS) ×3 IMPLANT
ELECT REM PT RETURN 9FT ADLT (ELECTROSURGICAL) ×3
ELECTRODE REM PT RTRN 9FT ADLT (ELECTROSURGICAL) ×2 IMPLANT
GAUZE 4X4 16PLY ~~LOC~~+RFID DBL (SPONGE) ×3 IMPLANT
GAUZE SPONGE 2X2 8PLY STRL LF (GAUZE/BANDAGES/DRESSINGS) ×2 IMPLANT
GAUZE SPONGE 4X4 12PLY STRL (GAUZE/BANDAGES/DRESSINGS) ×3 IMPLANT
GLOVE SURG POLYISO LF SZ8 (GLOVE) ×3 IMPLANT
GLOVE SURG UNDER POLY LF SZ7 (GLOVE) ×6 IMPLANT
GOWN STRL REUS W/ TWL LRG LVL3 (GOWN DISPOSABLE) ×4 IMPLANT
GOWN STRL REUS W/ TWL XL LVL3 (GOWN DISPOSABLE) ×2 IMPLANT
GOWN STRL REUS W/TWL LRG LVL3 (GOWN DISPOSABLE) ×6
GOWN STRL REUS W/TWL XL LVL3 (GOWN DISPOSABLE) ×3
GRAFT GORETEX STRETCH 6X40 (Vascular Products) ×3 IMPLANT
HEMOSTAT SNOW SURGICEL 2X4 (HEMOSTASIS) ×3 IMPLANT
INSERT FOGARTY SM (MISCELLANEOUS) ×3 IMPLANT
KIT BASIN OR (CUSTOM PROCEDURE TRAY) ×3 IMPLANT
KIT MICROPUNCTURE NIT STIFF (SHEATH) ×3 IMPLANT
KIT PALINDROME-P 55CM (CATHETERS) IMPLANT
KIT TURNOVER KIT B (KITS) ×3 IMPLANT
NEEDLE 18GX1X1/2 (RX/OR ONLY) (NEEDLE) ×3 IMPLANT
NEEDLE HYPO 25GX1X1/2 BEV (NEEDLE) ×3 IMPLANT
NS IRRIG 1000ML POUR BTL (IV SOLUTION) ×3 IMPLANT
PACK CV ACCESS (CUSTOM PROCEDURE TRAY) ×3 IMPLANT
PACK SURGICAL SETUP 50X90 (CUSTOM PROCEDURE TRAY) ×3 IMPLANT
PAD ARMBOARD 7.5X6 YLW CONV (MISCELLANEOUS) ×6 IMPLANT
SET MICROPUNCTURE 5F STIFF (MISCELLANEOUS) ×3 IMPLANT
SOAP 2 % CHG 4 OZ (WOUND CARE) ×3 IMPLANT
SPONGE GAUZE 2X2 STER 10/PKG (GAUZE/BANDAGES/DRESSINGS) ×1
STRIP CLOSURE SKIN 1/2X4 (GAUZE/BANDAGES/DRESSINGS) ×3 IMPLANT
SUT ETHILON 3 0 PS 1 (SUTURE) ×6 IMPLANT
SUT MNCRL AB 4-0 PS2 18 (SUTURE) ×12 IMPLANT
SUT PROLENE 5 0 C 1 24 (SUTURE) ×6 IMPLANT
SUT PROLENE 6 0 BV (SUTURE) ×3 IMPLANT
SUT VIC AB 3-0 SH 27 (SUTURE) ×6
SUT VIC AB 3-0 SH 27X BRD (SUTURE) ×4 IMPLANT
SYR 10ML LL (SYRINGE) ×3 IMPLANT
SYR 20ML LL LF (SYRINGE) ×6 IMPLANT
SYR 3ML LL SCALE MARK (SYRINGE) ×3 IMPLANT
SYR 5ML LL (SYRINGE) ×3 IMPLANT
SYR CONTROL 10ML LL (SYRINGE) ×3 IMPLANT
TOWEL GREEN STERILE (TOWEL DISPOSABLE) ×3 IMPLANT
TOWEL GREEN STERILE FF (TOWEL DISPOSABLE) ×6 IMPLANT
UNDERPAD 30X36 HEAVY ABSORB (UNDERPADS AND DIAPERS) ×3 IMPLANT
WATER STERILE IRR 1000ML POUR (IV SOLUTION) ×3 IMPLANT

## 2020-09-13 NOTE — Progress Notes (Signed)
Pt stated that he will not have anyone that can stay with him for 24 hours after receiving anesthesia care. Pt states that he lives alone and that no one will be able to stay. Dr. Stanford Breed made aware and stated that he will be admitted. Covid test done for pending admission. OR desk made aware.  Jacqlyn Larsen, RN

## 2020-09-13 NOTE — Discharge Instructions (Addendum)
   [facility]  Discharge Instructions  AV Fistula or Graft Surgery for Dialysis Access  Please refer to the following instructions for your post-procedure care. Your surgeon or physician assistant will discuss any changes with you.  Activity  You may drive the day following your surgery, if you are comfortable and no longer taking prescription pain medication. Resume full activity as the soreness in your incision resolves.  Bathing/Showering  You may shower after you go home. Keep your incision dry for 48 hours. Do not soak in a bathtub, hot tub, or swim until the incision heals completely. You may not shower if you have a hemodialysis catheter.  Incision Care  Clean your incision with mild soap and water after 48 hours. Pat the area dry with a clean towel. You do not need a bandage unless otherwise instructed. Do not apply any ointments or creams to your incision. You may have skin glue on your incision. Do not peel it off. It will come off on its own in about one week. Your arm may swell a bit after surgery. To reduce swelling use pillows to elevate your arm so it is above your heart. Your doctor will tell you if you need to lightly wrap your arm with an ACE bandage.  Diet  Resume your normal diet. There are not special food restrictions following this procedure. In order to heal from your surgery, it is CRITICAL to get adequate nutrition. Your body requires vitamins, minerals, and protein. Vegetables are the best source of vitamins and minerals. Vegetables also provide the perfect balance of protein. Processed food has little nutritional value, so try to avoid this.  Medications  Resume taking all of your medications. If your incision is causing pain, you may take over-the counter pain relievers such as acetaminophen (Tylenol). If you were prescribed a stronger pain medication, please be aware these medications can cause nausea and constipation. Prevent  nausea by taking the medication with a snack or meal. Avoid constipation by drinking plenty of fluids and eating foods with high amount of fiber, such as fruits, vegetables, and grains. Do not take Tylenol if you are taking prescription pain medications.     Follow up Your surgeon may want to see you in the office following your access surgery. If so, this will be arranged at the time of your surgery.  Please call us immediately for any of the following conditions:  Increased pain, redness, drainage (pus) from your incision site Fever of 101 degrees or higher Severe or worsening pain at your incision site Hand pain or numbness.  Reduce your risk of vascular disease:  Stop smoking. If you would like help, call [facility] at 1-800-QUIT-NOW 570-354-3749) or [facility] at [place] your cholesterol Maintain a desired weight Control your diabetes Keep your blood pressure down  Dialysis  It will take several weeks to several months for your new dialysis access to be ready for use. Your surgeon will determine when it is OK to use it. Your nephrologist will continue to direct your dialysis. You can continue to use your Permcath until your new access is ready for use.  If you have any questions, please call the office at [phone].  g

## 2020-09-13 NOTE — Transfer of Care (Signed)
Immediate Anesthesia Transfer of Care Note  Patient: Timothy Lane  Procedure(s) Performed: INSERTION OF DIALYSIS CATHETER REVISON OF LEFT UPPER EXTREMITY ARTERIOVENOUS FISTULA (Left)  Patient Location: PACU  Anesthesia Type:General  Level of Consciousness: drowsy and patient cooperative  Airway & Oxygen Therapy: Patient Spontanous Breathing and Patient connected to face mask oxygen  Post-op Assessment: Report given to RN and Post -op Vital signs reviewed and stable  Post vital signs: Reviewed and stable  Last Vitals:  Vitals Value Taken Time  BP 131/70 09/13/20 1120  Temp 36.1 C 09/13/20 1120  Pulse 60 09/13/20 1125  Resp 15 09/13/20 1125  SpO2 97 % 09/13/20 1125  Vitals shown include unvalidated device data.  Last Pain:  Vitals:   09/13/20 1120  TempSrc:   PainSc: 0-No pain         Complications: No notable events documented.

## 2020-09-13 NOTE — Interval H&P Note (Signed)
History and Physical Interval Note:  09/13/2020 7:46 AM  Timothy Lane  has presented today for surgery, with the diagnosis of ESRD.  The various methods of treatment have been discussed with the patient and family. After consideration of risks, benefits and other options for treatment, the patient has consented to  Procedure(s): INSERTION OF DIALYSIS CATHETER (N/A) REVISON OF LEFT UPPER EXTREMITY ARTERIOVENOUS FISTULA (Left) as a surgical intervention.  The patient's history has been reviewed, patient examined, no change in status, stable for surgery.  I have reviewed the patient's chart and labs.  Questions were answered to the patient's satisfaction.     [name]

## 2020-09-13 NOTE — Op Note (Signed)
DATE OF SERVICE: 09/13/2020  PATIENT:  Timothy Lane  70 y.o. male  PRE-OPERATIVE DIAGNOSIS:  ESRD, ulcerated left upper extremity AVF/G hybrid  POST-OPERATIVE DIAGNOSIS:  Same  PROCEDURE:   1) placement of right internal jugular tunneled dialysis catheter 2) revision of left upper extremity arteriovenous fistula/graft with interposition bypass (74m PTFE)  SURGEON:  Surgeon(s) and Role:    * HCherre Robins MD - Primary  ASSISTANT: MArlee Muslim PA-C  An assistant was required to facilitate exposure and expedite the case.  ANESTHESIA:   general  EBL: 1047m BLOOD ADMINISTERED:none  DRAINS: none   LOCAL MEDICATIONS USED:  LIDOCAINE   SPECIMEN:  none  COUNTS: confirmed correct.  TOURNIQUET:  none  PATIENT DISPOSITION:  PACU - hemodynamically stable.   Delay start of Pharmacological VTE agent (>24hrs) due to surgical blood loss or risk of bleeding: no  INDICATION FOR PROCEDURE: [name] male with ESRD dialyzing via left upper extremity arteriovenous graft/cephalic vein hybrid. He has unfortunately developed ulceration over an area of pseudoaneurysmal degeneration. After careful discussion of risks, benefits, and alternatives the patient was offered HD access revision. The patient  understood and wished to proceed.  OPERATIVE FINDINGS: Successful revision of AV access using new segment of arteriovenous graft tunneled lateral to the degeneration.  I sharply excised the ulcerated area of skin and removed an indwelling stent.  The excluded graft/fistula was left in place.  DESCRIPTION OF PROCEDURE: After identification of the patient in the pre-operative holding area, the patient was transferred to the operating room. The patient was positioned supine on the operating room table. Anesthesia was induced. The neck and left arm were prepped and draped in standard fashion. A surgical pause was performed confirming correct patient, procedure, and operative  location.  Using ultrasound guidance the right internal jugular vein was accessed with micropuncture technique.  Through the micropuncture sheath a floppy J-wire was advanced into the superior vena cava.  A small incision was made around the skin access point.  The access point was serially dilated under direct fluoroscopic guidance.  A peel-away sheath was introduced into the superior vena cava under fluoroscopic guidance.  A counterincision was made in the chest under the clavicle.  A 23 cm tunnel dialysis catheter was then tunneled under the skin, over the clavicle into the incision in the neck.  The tunneling device was removed and the catheter fed through the peel-away sheath into the superior vena cava.  The peel-away sheath was removed and the catheter gently pulled back.  Adequate position was confirmed with x-ray.  The catheter was tested and found to flush and draw back well.  Catheter was heparin locked.  Caps were applied.  Catheter was sutured to the skin.  The neck incision was closed with 4-0 Monocryl.  Small incisions were made over the proximal arteriovenous graft near the anastomosis with the brachial artery, and proximally in the arm over the cephalic vein.  The hybrid graft/vein was exposed and encircled with umbilical tape.  The access was clamped proximally and distally.  Using a curved, sheath tunneling device a 6 mm vascular graft was tunneled lateral to his existing access and into the exposures.  The graft proximally was transected with heavy Mayo scissor.  The proximal aspect of the new vascular graft was anastomosed end to end using 5-0 Prolene suture in continuous running fashion.  Similarly the fistula distally was transected with heavy Mayo scissors.  The distal aspect of the new  vascular graft was anastomosed to the cephalic vein fistula end-to-end using 5-0 Prolene suture in continuous running fashion.  Clamps were released on the inflow and outflow.  Doppler machine was used to  interrogate the flow in the new graft.  Strong thrill was noted in the outflow which obliterated with occlusion of the graft.  Hemostasis was achieved in the anastomoses in the surgical beds.  The incisions were closed in layers using 3-0 Vicryl and 4-0 Monocryl.  Dermabond was applied.  In the lip to form incision was made in the arm to excise the ulcerated area of skin.  This is carried down onto the fistula.  This was partially excised.  Stent was visualized in the lumen of the fistula.  This was removed manually.  Hemostasis was achieved in the surgical bed.  The fistula was ligated.  The skin was closed using 3-0 nylon.  A sterile bandage was applied.  Upon completion of the case instrument and sharps counts were confirmed correct. The patient was transferred to the PACU in good condition. I was present for all portions of the procedure.  [name]. Stanford Breed, MD [facility] Phone Number: [phone] 09/13/2020 11:21 AM

## 2020-09-13 NOTE — Anesthesia Postprocedure Evaluation (Signed)
Anesthesia Post Note  Patient: Timothy Lane  Procedure(s) Performed: INSERTION OF DIALYSIS CATHETER REVISON OF LEFT UPPER EXTREMITY ARTERIOVENOUS FISTULA (Left)     Patient location during evaluation: PACU Anesthesia Type: General Level of consciousness: awake and alert, oriented and patient cooperative Pain management: pain level controlled Vital Signs Assessment: post-procedure vital signs reviewed and stable Respiratory status: spontaneous breathing, nonlabored ventilation and respiratory function stable Cardiovascular status: blood pressure returned to baseline and stable Postop Assessment: no apparent nausea or vomiting Anesthetic complications: no   No notable events documented.  Last Vitals:  Vitals:   09/13/20 0648 09/13/20 1120  BP: (!) 144/78 131/70  Pulse: (!) 59 (!) 58  Resp: 17 11  Temp: 36.7 C (!) 36.1 C  SpO2: 100% 100%    Last Pain:  Vitals:   09/13/20 1120  TempSrc:   PainSc: 0-No pain                 Pervis Hocking

## 2020-09-13 NOTE — Anesthesia Procedure Notes (Signed)
Procedure Name: LMA Insertion Date/Time: 09/13/2020 9:09 AM Performed by: Gwyndolyn Saxon, CRNA Pre-anesthesia Checklist: Patient identified, Emergency Drugs available, Suction available and Patient being monitored Patient Re-evaluated:Patient Re-evaluated prior to induction Oxygen Delivery Method: Circle system utilized Preoxygenation: Pre-oxygenation with 100% oxygen Induction Type: IV induction Ventilation: Mask ventilation without difficulty LMA: LMA with gastric port inserted LMA Size: 5.0 Number of attempts: 1 Placement Confirmation: positive ETCO2 and breath sounds checked- equal and bilateral Tube secured with: Tape Dental Injury: Teeth and Oropharynx as per pre-operative assessment

## 2020-09-14 ENCOUNTER — Encounter (HOSPITAL_COMMUNITY): Payer: Self-pay | Admitting: Vascular Surgery

## 2020-10-15 ENCOUNTER — Other Ambulatory Visit: Payer: Self-pay

## 2020-10-15 DIAGNOSIS — N186 End stage renal disease: Secondary | ICD-10-CM

## 2020-10-23 ENCOUNTER — Ambulatory Visit (INDEPENDENT_AMBULATORY_CARE_PROVIDER_SITE_OTHER): Payer: No Typology Code available for payment source | Admitting: Physician Assistant

## 2020-10-23 ENCOUNTER — Other Ambulatory Visit: Payer: Self-pay

## 2020-10-23 ENCOUNTER — Ambulatory Visit (HOSPITAL_COMMUNITY)
Admission: RE | Admit: 2020-10-23 | Discharge: 2020-10-23 | Disposition: A | Discharge status: Home / Self Care | Payer: No Typology Code available for payment source | Source: Ambulatory Visit | Attending: Vascular Surgery | Admitting: Vascular Surgery

## 2020-10-23 VITALS — BP 141/75 | HR 56 | Temp 97.8°F | Resp 20 | Ht 73.0 in | Wt 210.1 lb

## 2020-10-23 DIAGNOSIS — N186 End stage renal disease: Secondary | ICD-10-CM

## 2020-10-23 DIAGNOSIS — Z992 Dependence on renal dialysis: Secondary | ICD-10-CM | POA: Insufficient documentation

## 2020-10-23 NOTE — Progress Notes (Signed)
    Postoperative Access Visit   History of Present Illness   Timothy Lane is a 70 y.o. year old male who presents for postoperative follow-up for: left upper arm arteriovenous graft revision (Date: 09/13/20).  The patient's wounds are healed.  The patient denies steal symptoms.  The patient is able to complete their activities of daily living.  He is dialyzing via R IJ TDC.   Physical Examination   Vitals:   10/23/20 1345  BP: (!) 141/75  Pulse: (!) 56  Resp: 20  Temp: 97.8 F (36.6 C)  TempSrc: Temporal  SpO2: 98%  Weight: 210 lb 1.6 oz (95.3 kg)  Height: '6\' 1"'$  (1.854 m)   Body mass index is 27.72 kg/m.  left arm Incisions are healed, palpable radial pulse, hand grip is 5/5, sensation in digits is intact, palpable thrill, bruit can be auscultated     Medical Decision Making   Timothy Lane is a 70 y.o. year old male who presents s/p left upper arm arteriovenous graft  Patent AVG without signs or symptoms of steal syndrome The patient's access is ready for use  The patient's tunneled dialysis catheter can be removed when Nephrology is comfortable with the performance of the AVG The patient may follow up on a prn basis   Dagoberto Ligas PA-C [facility] [place] Office: [facility] MD: Stanford Breed

## 2021-04-22 ENCOUNTER — Ambulatory Visit (HOSPITAL_COMMUNITY)
Admission: RE | Admit: 2021-04-22 | Discharge: 2021-04-22 | Disposition: A | Discharge status: Home / Self Care | Payer: No Typology Code available for payment source | Source: Ambulatory Visit | Attending: Vascular Surgery | Admitting: Vascular Surgery

## 2021-04-22 ENCOUNTER — Other Ambulatory Visit: Payer: Self-pay

## 2021-04-22 ENCOUNTER — Ambulatory Visit (INDEPENDENT_AMBULATORY_CARE_PROVIDER_SITE_OTHER)
Admission: RE | Admit: 2021-04-22 | Discharge: 2021-04-22 | Disposition: A | Discharge status: Home / Self Care | Payer: No Typology Code available for payment source | Source: Ambulatory Visit | Attending: Vascular Surgery | Admitting: Vascular Surgery

## 2021-04-22 DIAGNOSIS — N186 End stage renal disease: Secondary | ICD-10-CM

## 2021-04-22 DIAGNOSIS — Z992 Dependence on renal dialysis: Secondary | ICD-10-CM | POA: Diagnosis not present

## 2021-04-22 NOTE — Progress Notes (Signed)
[facility] [place] ? ?ASSESSMENT / PLAN: ?71 y.o. male with end-stage renal disease dialyzing Monday, Wednesday, and Friday via tunneled dialysis catheter.  Unfortunately interposition revision of his left upper extremity graft has failed.  I counseled him that new access would be needed.  I do not think it is left upper extremity suitable for further access.  We will plan to create a right brachiocephalic arteriovenous fistula on a nondialysis day in the near future. ? ?CHIEF COMPLAINT: ulcerated arteriovenous graft ? ?HISTORY OF PRESENT ILLNESS: ?Timothy Lane is a 71 y.o. male with end-stage renal disease dialyzing Monday, Wednesday, and Friday, who is referred to clinic from the [place] for evaluation of ulceration overlying his left upper extremity arteriovenous graft.  His graft has been functioning well for him for about 3 years.  He has noticed thin skin and scabs developing over his fistula over the past several weeks.  He has not had a bleeding episode from his graft.   ? ?04/23/21: Patient returns to clinic for follow-up.  Unfortunately his graft is thrombosed.  Attempted thrombectomy at [facility] was not successful.  He presents to discuss new access. ? ?Past Medical History:  ?Diagnosis Date  ? Arthritis   ? Asthma   ? Bronchitis   ? Chronic kidney disease   ? Diabetes mellitus   ? Hepatitis   ? hepatitis C  ? Hypertension   ? Stomach ulcer   ? ? ?Past Surgical History:  ?Procedure Laterality Date  ? ARTERIOVENOUS GRAFT PLACEMENT    ? AV FISTULA PLACEMENT    ? INSERTION OF DIALYSIS CATHETER N/A 09/13/2020  ? Procedure: INSERTION OF DIALYSIS CATHETER;  Surgeon: [name], MD;  Location: [facility] OR;  Service: Vascular;  Laterality: N/A;  ? KNEE ARTHROSCOPY    ? left knee 2 surgeries  ? REVISON OF ARTERIOVENOUS FISTULA Left 09/13/2020  ? Procedure: REVISON OF LEFT UPPER EXTREMITY ARTERIOVENOUS FISTULA;  Surgeon: [name], MD;  Location: [place];  Service: Vascular;  Laterality:  Left;  ? TOTAL KNEE ARTHROPLASTY  09/08/2011  ? Procedure: TOTAL KNEE ARTHROPLASTY;  Surgeon: Rudean Haskell, MD;  Location: [facility];  Service: Orthopedics;  Laterality: Left;  left total knee arthroplasty  ? ? ?No family history on file. ? ?Social History  ? ?Socioeconomic History  ? Marital status: Married  ?  Spouse name: Not on file  ? Number of children: Not on file  ? Years of education: Not on file  ? Highest education level: Not on file  ?Occupational History  ? Not on file  ?Tobacco Use  ? Smoking status: Former  ?  Years: 50.00  ?  Types: Cigarettes  ? Smokeless tobacco: Never  ? Tobacco comments:  ?  smokes only 2 cigs per day  ?Vaping Use  ? Vaping Use: Never used  ?Substance and Sexual Activity  ? Alcohol use: No  ? Drug use: Yes  ?  Types: Cocaine, Marijuana, "Crack" cocaine, Heroin, LSD, Methamphetamines  ? Sexual activity: Not on file  ?Other Topics Concern  ? Not on file  ?Social History Narrative  ? Not on file  ? ?Social Determinants of Health  ? ?Financial Resource Strain: Not on file  ?Food Insecurity: Not on file  ?Transportation Needs: Not on file  ?Physical Activity: Not on file  ?Stress: Not on file  ?Social Connections: Not on file  ?Intimate Partner Violence: Not on file  ? ? ?Allergies  ?Allergen Reactions  ? Latex  Rash  ? ? ?Current Outpatient Medications  ?Medication Sig Dispense Refill  ? allopurinol (ZYLOPRIM) 100 MG tablet Take 100 mg by mouth daily.    ? amLODipine (NORVASC) 10 MG tablet Take 10 mg by mouth daily.    ? calcium acetate (PHOSLO) 667 MG capsule Take 2,001 mg by mouth in the morning, at noon, and at bedtime.    ? diphenoxylate-atropine (LOMOTIL) 2.5-0.025 MG tablet Take 1 tablet by mouth in the morning, at noon, and at bedtime. With meals    ? finasteride (PROSCAR) 5 MG tablet Take 1 tablet by mouth daily.    ? folic acid-vitamin b complex-vitamin c-selenium-zinc (DIALYVITE) 3 MG TABS tablet Take 1 tablet by mouth daily.    ? furosemide (LASIX) 80 MG tablet Take 80 mg by  mouth every Tuesday, Thursday, and Saturday at 6 PM. Non dialysis days    ? gabapentin (NEURONTIN) 100 MG capsule Take 100 mg by mouth 3 (three) times daily.    ? hydrALAZINE (APRESOLINE) 25 MG tablet Take 25 mg by mouth 3 (three) times daily.    ? insulin NPH-insulin regular (NOVOLIN 70/30) (70-30) 100 UNIT/ML injection Inject 18-23 Units into the skin See admin instructions. 23 units in the morning ?18units at night    ? loperamide (IMODIUM) 2 MG capsule Take 2 mg by mouth as needed for diarrhea or loose stools.    ? metoprolol tartrate (LOPRESSOR) 50 MG tablet Take 50 mg by mouth 2 (two) times daily.    ? oxyCODONE-acetaminophen (PERCOCET) 5-325 MG tablet Take 1 tablet by mouth every 6 (six) hours as needed for severe pain. 20 tablet 0  ? triamcinolone cream (KENALOG) 0.1 % Apply 1 application topically 2 (two) times daily.    ? ?No current facility-administered medications for this visit.  ? ? ?REVIEW OF SYSTEMS:  ?[X]  denotes positive finding, [ ]  denotes negative finding ?Cardiac  Comments:  ?Chest pain or chest pressure:    ?Shortness of breath upon exertion:    ?Short of breath when lying flat:    ?Irregular heart rhythm:    ?    ?Vascular    ?Pain in calf, thigh, or hip brought on by ambulation:    ?Pain in feet at night that wakes you up from your sleep:     ?Blood clot in your veins:    ?Leg swelling:     ?    ?Pulmonary    ?Oxygen at home:    ?Productive cough:     ?Wheezing:     ?    ?Neurologic    ?Sudden weakness in arms or legs:     ?Sudden numbness in arms or legs:     ?Sudden onset of difficulty speaking or slurred speech:    ?Temporary loss of vision in one eye:     ?Problems with dizziness:     ?    ?Gastrointestinal    ?Blood in stool:     ?Vomited blood:     ?    ?Genitourinary    ?Burning when urinating:     ?Blood in urine:    ?    ?Psychiatric    ?Major depression:     ?    ?Hematologic    ?Bleeding problems:    ?Problems with blood clotting too easily:    ?    ?Skin    ?Rashes or ulcers:     ?    ?Constitutional    ?Fever or chills:    ? ? ?PHYSICAL  EXAM ?Vitals:  ? 04/23/21 1405  ?BP: 95/60  ?Pulse: 62  ?Resp: 20  ?Temp: 98.6 ?F (37 ?C)  ?SpO2: 96%  ?Weight: 211 lb (95.7 kg)  ?Height: 6\' 1"  (1.854 m)  ? ? ? ?Constitutional: well appearing. no distress. Appears well nourished.  ?Neurologic: CN intact. no focal findings. no sensory loss. ?Psychiatric:  Mood and affect symmetric and appropriate. ?Eyes:  No icterus. No conjunctival pallor. ?Ears, nose, throat:  mucous membranes moist. Midline trachea.  ?Cardiac: regular rate and rhythm.  ?Respiratory:  unlabored. ?Abdominal:  soft, non-tender, non-distended.  ?Peripheral vascular: Left upper extremity arteriovenous graft thrombosed.  2+ radial pulses. ?Extremity: no edema. no cyanosis. no pallor.  ?Skin: no gangrene. no ulceration.  ?Lymphatic: no Stemmer's sign. no palpable lymphadenopathy. ? ?PERTINENT LABORATORY AND RADIOLOGIC DATA ? ?Most recent CBC ?CBC Latest Ref Rng & Units 09/13/2020 09/11/2011 09/10/2011  ?WBC 4.0 - 10.5 K/uL - 4.4 5.6  ?Hemoglobin 13.0 - 17.0 g/dL 11.9(L) 7.0(L) 8.7(L)  ?Hematocrit 39.0 - 52.0 % 35.0(L) 19.7(L) 24.8(L)  ?Platelets 150 - 400 K/uL - 84(L) 97(L)  ?  ? ?Most recent CMP ?CMP Latest Ref Rng & Units 09/13/2020 09/11/2011 09/10/2011  ?Glucose 70 - 99 mg/dL 121(H) 191(H) 489(H)  ?BUN 8 - 23 mg/dL 25(H) 28(H) -  ?Creatinine 0.61 - 1.24 mg/dL 6.70(H) 1.24 -  ?Sodium 135 - 145 mmol/L 137 136 -  ?Potassium 3.5 - 5.1 mmol/L 4.1 4.1 -  ?Chloride 98 - 111 mmol/L 98 99 -  ?CO2 19 - 32 mEq/L - 30 -  ?Calcium 8.4 - 10.5 mg/dL - 8.2(L) -  ?Total Protein 6.0 - 8.3 g/dL - - -  ?Total Bilirubin 0.3 - 1.2 mg/dL - - -  ?Alkaline Phos 39 - 117 U/L - - -  ?AST 0 - 37 U/L - - -  ?ALT 0 - 53 U/L - - -  ? ? ?Renal function ?CrCl cannot be calculated (Patient's most recent lab result is older than the maximum 21 days allowed.). ? ?Hgb A1c MFr Bld (%)  ?Date Value  ?09/08/2011 7.2 (H)  ? ?Preoperative venous vein mapping for dialysis access surgery  personally reviewed.  Suitable right cephalic vein for autogenous fistula creation. ? ?[name]. Stanford Breed, MD ?[facility] [place] ?Office Phone Number: [phone] ?04/22/2021 7:

## 2021-04-22 NOTE — H&P (View-Only) (Signed)
[facility] [place] ? ?ASSESSMENT / PLAN: ?71 y.o. male with end-stage renal disease dialyzing Monday, Wednesday, and Friday via tunneled dialysis catheter.  Unfortunately interposition revision of his left upper extremity graft has failed.  I counseled him that new access would be needed.  I do not think it is left upper extremity suitable for further access.  We will plan to create a right brachiocephalic arteriovenous fistula on a nondialysis day in the near future. ? ?CHIEF COMPLAINT: ulcerated arteriovenous graft ? ?HISTORY OF PRESENT ILLNESS: ?Timothy Lane is a 71 y.o. male with end-stage renal disease dialyzing Monday, Wednesday, and Friday, who is referred to clinic from the [place] for evaluation of ulceration overlying his left upper extremity arteriovenous graft.  His graft has been functioning well for him for about 3 years.  He has noticed thin skin and scabs developing over his fistula over the past several weeks.  He has not had a bleeding episode from his graft.   ? ?04/23/21: Patient returns to clinic for follow-up.  Unfortunately his graft is thrombosed.  Attempted thrombectomy at [facility] was not successful.  He presents to discuss new access. ? ?Past Medical History:  ?Diagnosis Date  ? Arthritis   ? Asthma   ? Bronchitis   ? Chronic kidney disease   ? Diabetes mellitus   ? Hepatitis   ? hepatitis C  ? Hypertension   ? Stomach ulcer   ? ? ?Past Surgical History:  ?Procedure Laterality Date  ? ARTERIOVENOUS GRAFT PLACEMENT    ? AV FISTULA PLACEMENT    ? INSERTION OF DIALYSIS CATHETER N/A 09/13/2020  ? Procedure: INSERTION OF DIALYSIS CATHETER;  Surgeon: [name], MD;  Location: [facility] OR;  Service: Vascular;  Laterality: N/A;  ? KNEE ARTHROSCOPY    ? left knee 2 surgeries  ? REVISON OF ARTERIOVENOUS FISTULA Left 09/13/2020  ? Procedure: REVISON OF LEFT UPPER EXTREMITY ARTERIOVENOUS FISTULA;  Surgeon: [name], MD;  Location: [facility];  Service: Vascular;  Laterality:  Left;  ? TOTAL KNEE ARTHROPLASTY  09/08/2011  ? Procedure: TOTAL KNEE ARTHROPLASTY;  Surgeon: Rudean Haskell, MD;  Location: [facility];  Service: Orthopedics;  Laterality: Left;  left total knee arthroplasty  ? ? ?No family history on file. ? ?Social History  ? ?Socioeconomic History  ? Marital status: Married  ?  Spouse name: Not on file  ? Number of children: Not on file  ? Years of education: Not on file  ? Highest education level: Not on file  ?Occupational History  ? Not on file  ?Tobacco Use  ? Smoking status: Former  ?  Years: 50.00  ?  Types: Cigarettes  ? Smokeless tobacco: Never  ? Tobacco comments:  ?  smokes only 2 cigs per day  ?Vaping Use  ? Vaping Use: Never used  ?Substance and Sexual Activity  ? Alcohol use: No  ? Drug use: Yes  ?  Types: Cocaine, Marijuana, "Crack" cocaine, Heroin, LSD, Methamphetamines  ? Sexual activity: Not on file  ?Other Topics Concern  ? Not on file  ?Social History Narrative  ? Not on file  ? ?Social Determinants of Health  ? ?Financial Resource Strain: Not on file  ?Food Insecurity: Not on file  ?Transportation Needs: Not on file  ?Physical Activity: Not on file  ?Stress: Not on file  ?Social Connections: Not on file  ?Intimate Partner Violence: Not on file  ? ? ?Allergies  ?Allergen Reactions  ? Latex  Rash  ? ? ?Current Outpatient Medications  ?Medication Sig Dispense Refill  ? allopurinol (ZYLOPRIM) 100 MG tablet Take 100 mg by mouth daily.    ? amLODipine (NORVASC) 10 MG tablet Take 10 mg by mouth daily.    ? calcium acetate (PHOSLO) 667 MG capsule Take 2,001 mg by mouth in the morning, at noon, and at bedtime.    ? diphenoxylate-atropine (LOMOTIL) 2.5-0.025 MG tablet Take 1 tablet by mouth in the morning, at noon, and at bedtime. With meals    ? finasteride (PROSCAR) 5 MG tablet Take 1 tablet by mouth daily.    ? folic acid-vitamin b complex-vitamin c-selenium-zinc (DIALYVITE) 3 MG TABS tablet Take 1 tablet by mouth daily.    ? furosemide (LASIX) 80 MG tablet Take 80 mg by  mouth every Tuesday, Thursday, and Saturday at 6 PM. Non dialysis days    ? gabapentin (NEURONTIN) 100 MG capsule Take 100 mg by mouth 3 (three) times daily.    ? hydrALAZINE (APRESOLINE) 25 MG tablet Take 25 mg by mouth 3 (three) times daily.    ? insulin NPH-insulin regular (NOVOLIN 70/30) (70-30) 100 UNIT/ML injection Inject 18-23 Units into the skin See admin instructions. 23 units in the morning ?18units at night    ? loperamide (IMODIUM) 2 MG capsule Take 2 mg by mouth as needed for diarrhea or loose stools.    ? metoprolol tartrate (LOPRESSOR) 50 MG tablet Take 50 mg by mouth 2 (two) times daily.    ? oxyCODONE-acetaminophen (PERCOCET) 5-325 MG tablet Take 1 tablet by mouth every 6 (six) hours as needed for severe pain. 20 tablet 0  ? triamcinolone cream (KENALOG) 0.1 % Apply 1 application topically 2 (two) times daily.    ? ?No current facility-administered medications for this visit.  ? ? ?REVIEW OF SYSTEMS:  ?[X]  denotes positive finding, [ ]  denotes negative finding ?Cardiac  Comments:  ?Chest pain or chest pressure:    ?Shortness of breath upon exertion:    ?Short of breath when lying flat:    ?Irregular heart rhythm:    ?    ?Vascular    ?Pain in calf, thigh, or hip brought on by ambulation:    ?Pain in feet at night that wakes you up from your sleep:     ?Blood clot in your veins:    ?Leg swelling:     ?    ?Pulmonary    ?Oxygen at home:    ?Productive cough:     ?Wheezing:     ?    ?Neurologic    ?Sudden weakness in arms or legs:     ?Sudden numbness in arms or legs:     ?Sudden onset of difficulty speaking or slurred speech:    ?Temporary loss of vision in one eye:     ?Problems with dizziness:     ?    ?Gastrointestinal    ?Blood in stool:     ?Vomited blood:     ?    ?Genitourinary    ?Burning when urinating:     ?Blood in urine:    ?    ?Psychiatric    ?Major depression:     ?    ?Hematologic    ?Bleeding problems:    ?Problems with blood clotting too easily:    ?    ?Skin    ?Rashes or ulcers:     ?    ?Constitutional    ?Fever or chills:    ? ? ?PHYSICAL  EXAM ?Vitals:  ? 04/23/21 1405  ?BP: 95/60  ?Pulse: 62  ?Resp: 20  ?Temp: 98.6 ?F (37 ?C)  ?SpO2: 96%  ?Weight: 211 lb (95.7 kg)  ?Height: 6\' 1"  (1.854 m)  ? ? ? ?Constitutional: well appearing. no distress. Appears well nourished.  ?Neurologic: CN intact. no focal findings. no sensory loss. ?Psychiatric:  Mood and affect symmetric and appropriate. ?Eyes:  No icterus. No conjunctival pallor. ?Ears, nose, throat:  mucous membranes moist. Midline trachea.  ?Cardiac: regular rate and rhythm.  ?Respiratory:  unlabored. ?Abdominal:  soft, non-tender, non-distended.  ?Peripheral vascular: Left upper extremity arteriovenous graft thrombosed.  2+ radial pulses. ?Extremity: no edema. no cyanosis. no pallor.  ?Skin: no gangrene. no ulceration.  ?Lymphatic: no Stemmer's sign. no palpable lymphadenopathy. ? ?PERTINENT LABORATORY AND RADIOLOGIC DATA ? ?Most recent CBC ?CBC Latest Ref Rng & Units 09/13/2020 09/11/2011 09/10/2011  ?WBC 4.0 - 10.5 K/uL - 4.4 5.6  ?Hemoglobin 13.0 - 17.0 g/dL 11.9(L) 7.0(L) 8.7(L)  ?Hematocrit 39.0 - 52.0 % 35.0(L) 19.7(L) 24.8(L)  ?Platelets 150 - 400 K/uL - 84(L) 97(L)  ?  ? ?Most recent CMP ?CMP Latest Ref Rng & Units 09/13/2020 09/11/2011 09/10/2011  ?Glucose 70 - 99 mg/dL 121(H) 191(H) 489(H)  ?BUN 8 - 23 mg/dL 25(H) 28(H) -  ?Creatinine 0.61 - 1.24 mg/dL 6.70(H) 1.24 -  ?Sodium 135 - 145 mmol/L 137 136 -  ?Potassium 3.5 - 5.1 mmol/L 4.1 4.1 -  ?Chloride 98 - 111 mmol/L 98 99 -  ?CO2 19 - 32 mEq/L - 30 -  ?Calcium 8.4 - 10.5 mg/dL - 8.2(L) -  ?Total Protein 6.0 - 8.3 g/dL - - -  ?Total Bilirubin 0.3 - 1.2 mg/dL - - -  ?Alkaline Phos 39 - 117 U/L - - -  ?AST 0 - 37 U/L - - -  ?ALT 0 - 53 U/L - - -  ? ? ?Renal function ?CrCl cannot be calculated (Patient's most recent lab result is older than the maximum 21 days allowed.). ? ?Hgb A1c MFr Bld (%)  ?Date Value  ?09/08/2011 7.2 (H)  ? ?Preoperative venous vein mapping for dialysis access surgery  personally reviewed.  Suitable right cephalic vein for autogenous fistula creation. ? ?[name]. Stanford Breed, MD ?[facility] [place] ?Office Phone Number: [phone] ?04/22/2021 7:

## 2021-04-23 ENCOUNTER — Other Ambulatory Visit: Payer: Self-pay

## 2021-04-23 ENCOUNTER — Encounter: Payer: Self-pay | Admitting: Vascular Surgery

## 2021-04-23 ENCOUNTER — Ambulatory Visit (INDEPENDENT_AMBULATORY_CARE_PROVIDER_SITE_OTHER): Payer: No Typology Code available for payment source | Admitting: Vascular Surgery

## 2021-04-23 VITALS — BP 95/60 | HR 62 | Temp 98.6°F | Resp 20 | Ht 73.0 in | Wt 211.0 lb

## 2021-04-23 DIAGNOSIS — Z992 Dependence on renal dialysis: Secondary | ICD-10-CM | POA: Diagnosis not present

## 2021-04-23 DIAGNOSIS — N186 End stage renal disease: Secondary | ICD-10-CM

## 2021-04-30 ENCOUNTER — Other Ambulatory Visit: Payer: Self-pay

## 2021-04-30 NOTE — Addendum Note (Signed)
Addended by: Estell Harpin B on: 04/30/2021 12:32 PM ? ? Modules accepted: Orders ? ?

## 2021-05-01 ENCOUNTER — Other Ambulatory Visit: Payer: Self-pay

## 2021-05-01 ENCOUNTER — Encounter (HOSPITAL_COMMUNITY): Payer: Self-pay | Admitting: Vascular Surgery

## 2021-05-01 NOTE — Progress Notes (Signed)
TWO VISITORS ARE ALLOWED TO COME WITH YOU AND STAY IN THE WAITING ROOM ONLY DURING PRE OP AND PROCEDURE DAY OF SURGERY.  ? ?PCP - [facility] ?Cardiologist - n/a ? ?Chest x-ray - 09/13/20 (1V) ?EKG - DOS ?Stress Test - n/a ?ECHO - n/a ?Cardiac Cath - n/a ? ?ICD Pacemaker/Loop - n/a ? ?Sleep Study -  n/a ?CPAP - none ? ?Patient does not check blood sugar. ?THE NIGHT BEFORE SURGERY, take 12 units of Novolog Mix 70/30 Insulin.     ?THE MORNING OF SURGERY, do not take Novolog Mix 70/30 Insulin. ? ?Anesthesia review: Yes ? ?STOP now taking any Aspirin (unless otherwise instructed by your surgeon), Aleve, Naproxen, Ibuprofen, Motrin, Advil, Goody's, BC's, all herbal medications, fish oil, and all vitamins.  ? ?Coronavirus Screening ?Covid test n/a Ambulatory Surgery  ?Do you have any of the following symptoms:  ?Cough yes/no: No ?Fever (>100.52F)  yes/no: No ?Runny nose yes/no: No ?Sore throat yes/no: No ?Difficulty breathing/shortness of breath  yes/no: No ? ?Have you traveled in the last 14 days and where? yes/no: No ? ?Patient verbalized understanding of instructions that were given via phone. ?

## 2021-05-02 ENCOUNTER — Ambulatory Visit (HOSPITAL_BASED_OUTPATIENT_CLINIC_OR_DEPARTMENT_OTHER): Payer: No Typology Code available for payment source | Admitting: Physician Assistant

## 2021-05-02 ENCOUNTER — Encounter (HOSPITAL_COMMUNITY): Payer: Self-pay | Admitting: Vascular Surgery

## 2021-05-02 ENCOUNTER — Observation Stay (HOSPITAL_COMMUNITY)
Admission: RE | Admit: 2021-05-02 | Discharge: 2021-05-02 | Disposition: A | Discharge status: Home / Self Care | Payer: No Typology Code available for payment source | Attending: Vascular Surgery | Admitting: Vascular Surgery

## 2021-05-02 ENCOUNTER — Other Ambulatory Visit: Payer: Self-pay

## 2021-05-02 ENCOUNTER — Encounter (HOSPITAL_COMMUNITY)
Admission: RE | Disposition: A | Discharge status: Home / Self Care | Payer: Self-pay | Source: Home / Self Care | Attending: Vascular Surgery

## 2021-05-02 ENCOUNTER — Ambulatory Visit (HOSPITAL_COMMUNITY): Payer: No Typology Code available for payment source | Admitting: Physician Assistant

## 2021-05-02 DIAGNOSIS — N185 Chronic kidney disease, stage 5: Secondary | ICD-10-CM | POA: Diagnosis not present

## 2021-05-02 DIAGNOSIS — Z79899 Other long term (current) drug therapy: Secondary | ICD-10-CM | POA: Insufficient documentation

## 2021-05-02 DIAGNOSIS — E1122 Type 2 diabetes mellitus with diabetic chronic kidney disease: Secondary | ICD-10-CM | POA: Insufficient documentation

## 2021-05-02 DIAGNOSIS — J45909 Unspecified asthma, uncomplicated: Secondary | ICD-10-CM | POA: Diagnosis not present

## 2021-05-02 DIAGNOSIS — Z87891 Personal history of nicotine dependence: Secondary | ICD-10-CM | POA: Diagnosis not present

## 2021-05-02 DIAGNOSIS — Z794 Long term (current) use of insulin: Secondary | ICD-10-CM | POA: Insufficient documentation

## 2021-05-02 DIAGNOSIS — Z96652 Presence of left artificial knee joint: Secondary | ICD-10-CM | POA: Diagnosis not present

## 2021-05-02 DIAGNOSIS — Z992 Dependence on renal dialysis: Secondary | ICD-10-CM

## 2021-05-02 DIAGNOSIS — Z91040 Latex allergy status: Secondary | ICD-10-CM | POA: Diagnosis not present

## 2021-05-02 DIAGNOSIS — N186 End stage renal disease: Secondary | ICD-10-CM

## 2021-05-02 DIAGNOSIS — I120 Hypertensive chronic kidney disease with stage 5 chronic kidney disease or end stage renal disease: Secondary | ICD-10-CM

## 2021-05-02 HISTORY — PX: AV FISTULA PLACEMENT: SHX1204

## 2021-05-02 HISTORY — DX: Anemia, unspecified: D64.9

## 2021-05-02 LAB — POCT I-STAT, CHEM 8
BUN: 32 mg/dL — ABNORMAL HIGH (ref 8–23)
Calcium, Ion: 1.04 mmol/L — ABNORMAL LOW (ref 1.15–1.40)
Chloride: 97 mmol/L — ABNORMAL LOW (ref 98–111)
Creatinine, Ser: 8.2 mg/dL — ABNORMAL HIGH (ref 0.61–1.24)
Glucose, Bld: 157 mg/dL — ABNORMAL HIGH (ref 70–99)
HCT: 33 % — ABNORMAL LOW (ref 39.0–52.0)
Hemoglobin: 11.2 g/dL — ABNORMAL LOW (ref 13.0–17.0)
Potassium: 4.3 mmol/L (ref 3.5–5.1)
Sodium: 136 mmol/L (ref 135–145)
TCO2: 28 mmol/L (ref 22–32)

## 2021-05-02 LAB — GLUCOSE, CAPILLARY
Glucose-Capillary: 147 mg/dL — ABNORMAL HIGH (ref 70–99)
Glucose-Capillary: 150 mg/dL — ABNORMAL HIGH (ref 70–99)

## 2021-05-02 SURGERY — ARTERIOVENOUS (AV) FISTULA CREATION
Anesthesia: Monitor Anesthesia Care | Site: Arm Upper | Laterality: Right

## 2021-05-02 MED ORDER — FENTANYL CITRATE (PF) 100 MCG/2ML IJ SOLN: 25.0000 ug | INTRAMUSCULAR | Status: DC | PRN

## 2021-05-02 MED ORDER — PROPOFOL 500 MG/50ML IV EMUL
INTRAVENOUS | Status: DC | PRN
  Administered 2021-05-02: 50 ug/kg/min via INTRAVENOUS

## 2021-05-02 MED ORDER — FENTANYL CITRATE (PF) 100 MCG/2ML IJ SOLN
INTRAMUSCULAR | Status: AC
  Administered 2021-05-02: 100 ug
  Filled 2021-05-02: qty 2

## 2021-05-02 MED ORDER — BUPIVACAINE HCL (PF) 0.25 % IJ SOLN
INTRAMUSCULAR | Status: DC | PRN
  Administered 2021-05-02: 10 mL

## 2021-05-02 MED ORDER — CEFAZOLIN SODIUM-DEXTROSE 2-4 GM/100ML-% IV SOLN
2.0000 g | INTRAVENOUS | Status: AC
  Administered 2021-05-02: 2 g via INTRAVENOUS
  Filled 2021-05-02: qty 100

## 2021-05-02 MED ORDER — CHLORHEXIDINE GLUCONATE 4 % EX LIQD: 60.0000 mL | Freq: Once | CUTANEOUS | Status: DC

## 2021-05-02 MED ORDER — SODIUM CHLORIDE 0.9 % IV SOLN: INTRAVENOUS | Status: DC

## 2021-05-02 MED ORDER — BUPIVACAINE HCL (PF) 0.5 % IJ SOLN
INTRAMUSCULAR | Status: DC | PRN
  Administered 2021-05-02: 30 mL via PERINEURAL

## 2021-05-02 MED ORDER — INSULIN ASPART 100 UNIT/ML IJ SOLN: 0.0000 [IU] | INTRAMUSCULAR | Status: DC | PRN

## 2021-05-02 MED ORDER — HEPARIN 6000 UNIT IRRIGATION SOLUTION
Status: AC
  Filled 2021-05-02: qty 500

## 2021-05-02 MED ORDER — FENTANYL CITRATE PF 50 MCG/ML IJ SOSY: 50.0000 ug | PREFILLED_SYRINGE | Freq: Once | INTRAMUSCULAR | Status: DC

## 2021-05-02 MED ORDER — 0.9 % SODIUM CHLORIDE (POUR BTL) OPTIME
TOPICAL | Status: DC | PRN
  Administered 2021-05-02: 1000 mL

## 2021-05-02 MED ORDER — CHLORHEXIDINE GLUCONATE 0.12 % MT SOLN
OROMUCOSAL | Status: DC
Start: 2021-05-02 — End: 2021-05-02
  Filled 2021-05-02: qty 15

## 2021-05-02 MED ORDER — HEPARIN SODIUM (PORCINE) 1000 UNIT/ML IJ SOLN
INTRAMUSCULAR | Status: DC | PRN
  Administered 2021-05-02: 5000 [IU] via INTRAVENOUS

## 2021-05-02 MED ORDER — FENTANYL CITRATE (PF) 100 MCG/2ML IJ SOLN: 50.0000 ug | Freq: Once | INTRAMUSCULAR | Status: DC

## 2021-05-02 MED ORDER — MIDAZOLAM HCL 2 MG/2ML IJ SOLN
INTRAMUSCULAR | Status: AC
  Filled 2021-05-02: qty 2

## 2021-05-02 MED ORDER — METOPROLOL TARTRATE 50 MG PO TABS
ORAL_TABLET | ORAL | Status: AC
  Administered 2021-05-02: 50 mg
  Filled 2021-05-02: qty 1

## 2021-05-02 MED ORDER — OXYCODONE-ACETAMINOPHEN 5-325 MG PO TABS: 1.0000 | ORAL_TABLET | Freq: Four times a day (QID) | ORAL | 0 refills | Status: DC | PRN

## 2021-05-02 MED ORDER — HEPARIN 6000 UNIT IRRIGATION SOLUTION
Status: DC | PRN
  Administered 2021-05-02: 1

## 2021-05-02 MED ORDER — ACETAMINOPHEN 500 MG PO TABS
1000.0000 mg | ORAL_TABLET | Freq: Once | ORAL | Status: AC
  Administered 2021-05-02: 1000 mg via ORAL
  Filled 2021-05-02: qty 2

## 2021-05-02 SURGICAL SUPPLY — 40 items
ADH SKN CLS APL DERMABOND .7 (GAUZE/BANDAGES/DRESSINGS) ×1
APL PRP STRL LF DISP 70% ISPRP (MISCELLANEOUS) ×1
APL SKNCLS STERI-STRIP NONHPOA (GAUZE/BANDAGES/DRESSINGS) ×1
ARMBAND PINK RESTRICT EXTREMIT (MISCELLANEOUS) ×2 IMPLANT
BENZOIN TINCTURE PRP APPL 2/3 (GAUZE/BANDAGES/DRESSINGS) ×2 IMPLANT
CANISTER SUCT 3000ML PPV (MISCELLANEOUS) ×2 IMPLANT
CANNULA VESSEL 3MM 2 BLNT TIP (CANNULA) ×2 IMPLANT
CHLORAPREP W/TINT 26 (MISCELLANEOUS) ×2 IMPLANT
CLIP LIGATING EXTRA MED SLVR (CLIP) ×2 IMPLANT
CLIP LIGATING EXTRA SM BLUE (MISCELLANEOUS) ×2 IMPLANT
COVER PROBE W GEL 5X96 (DRAPES) IMPLANT
DERMABOND ADVANCED (GAUZE/BANDAGES/DRESSINGS) ×1
DERMABOND ADVANCED .7 DNX12 (GAUZE/BANDAGES/DRESSINGS) IMPLANT
ELECT REM PT RETURN 9FT ADLT (ELECTROSURGICAL) ×2
ELECTRODE REM PT RTRN 9FT ADLT (ELECTROSURGICAL) ×1 IMPLANT
GLOVE SURG POLYISO LF SZ8 (GLOVE) ×2 IMPLANT
GOWN STRL REUS W/ TWL LRG LVL3 (GOWN DISPOSABLE) ×2 IMPLANT
GOWN STRL REUS W/ TWL XL LVL3 (GOWN DISPOSABLE) ×1 IMPLANT
GOWN STRL REUS W/TWL LRG LVL3 (GOWN DISPOSABLE) ×4
GOWN STRL REUS W/TWL XL LVL3 (GOWN DISPOSABLE) ×2
INSERT FOGARTY SM (MISCELLANEOUS) IMPLANT
KIT BASIN OR (CUSTOM PROCEDURE TRAY) ×2 IMPLANT
KIT TURNOVER KIT B (KITS) ×2 IMPLANT
LIGACLIP SM TITANIUM (CLIP) ×2 IMPLANT
NDL 18GX1X1/2 (RX/OR ONLY) (NEEDLE) IMPLANT
NEEDLE 18GX1X1/2 (RX/OR ONLY) (NEEDLE) IMPLANT
NS IRRIG 1000ML POUR BTL (IV SOLUTION) ×2 IMPLANT
PACK CV ACCESS (CUSTOM PROCEDURE TRAY) ×2 IMPLANT
PAD ARMBOARD 7.5X6 YLW CONV (MISCELLANEOUS) ×4 IMPLANT
SLING ARM FOAM STRAP LRG (SOFTGOODS) IMPLANT
SLING ARM FOAM STRAP MED (SOFTGOODS) IMPLANT
STRIP CLOSURE SKIN 1/2X4 (GAUZE/BANDAGES/DRESSINGS) ×2 IMPLANT
SUT MNCRL AB 4-0 PS2 18 (SUTURE) ×2 IMPLANT
SUT PROLENE 6 0 BV (SUTURE) ×3 IMPLANT
SUT VIC AB 3-0 SH 27 (SUTURE) ×2
SUT VIC AB 3-0 SH 27X BRD (SUTURE) ×1 IMPLANT
SYR 3ML LL SCALE MARK (SYRINGE) IMPLANT
TOWEL GREEN STERILE (TOWEL DISPOSABLE) ×2 IMPLANT
UNDERPAD 30X36 HEAVY ABSORB (UNDERPADS AND DIAPERS) ×2 IMPLANT
WATER STERILE IRR 1000ML POUR (IV SOLUTION) ×2 IMPLANT

## 2021-05-02 NOTE — Progress Notes (Signed)
Patient reports that no one is able to spend rest of day with him charge nurse aware ?

## 2021-05-02 NOTE — Transfer of Care (Signed)
Immediate Anesthesia Transfer of Care Note ? ?Patient: Timothy Lane ? ?Procedure(s) Performed: RIGHT ARM BRACHIOCEPHALIC ARTERIOVENOUS FISTULA CREATION (Right: Arm Upper) ? ?Patient Location: PACU ? ?Anesthesia Type:MAC and Regional ? ?Level of Consciousness: awake, alert  and oriented ? ?Airway & Oxygen Therapy: Patient Spontanous Breathing ? ?Post-op Assessment: Report given to RN and Post -op Vital signs reviewed and stable ? ?Post vital signs: Reviewed and stable ? ?Last Vitals:  ?Vitals Value Taken Time  ?BP    ?Temp    ?Pulse 51 05/02/21 1029  ?Resp 15 05/02/21 1029  ?SpO2 98 % 05/02/21 1029  ?Vitals shown include unvalidated device data. ? ?Last Pain:  ?Vitals:  ? 05/02/21 0801  ?TempSrc:   ?PainSc: 0-No pain  ?   ? ?  ? ?Complications: No notable events documented. ?

## 2021-05-02 NOTE — Anesthesia Preprocedure Evaluation (Addendum)
Anesthesia Evaluation  ?Patient identified by MRN, date of birth, ID band ?Patient awake ? ? ? ?Reviewed: ?Allergy & Precautions, H&P , NPO status , Patient's Chart, lab work & pertinent test results, reviewed documented beta blocker date and time  ? ?Airway ?Mallampati: II ? ?TM Distance: >3 FB ?Neck ROM: Full ? ? ? Dental ?no notable dental hx. ?(+) Edentulous Upper, Partial Lower, Dental Advisory Given ?  ?Pulmonary ?asthma , former smoker,  ?  ?Pulmonary exam normal ?breath sounds clear to auscultation ? ? ? ? ? ? Cardiovascular ?hypertension, Pt. on medications and Pt. on home beta blockers ? ?Rhythm:Regular Rate:Normal ? ? ?  ?Neuro/Psych ?negative neurological ROS ? negative psych ROS  ? GI/Hepatic ?Neg liver ROS, PUD,   ?Endo/Other  ?diabetes, Insulin Dependent ? Renal/GU ?ESRF and DialysisRenal disease  ?negative genitourinary ?  ?Musculoskeletal ? ?(+) Arthritis , Osteoarthritis,   ? Abdominal ?  ?Peds ? Hematology ? ?(+) Blood dyscrasia, anemia ,   ?Anesthesia Other Findings ? ? Reproductive/Obstetrics ?negative OB ROS ? ?  ? ? ? ? ? ? ? ? ? ? ? ? ? ?  ?  ? ? ? ? ? ? ? ?Anesthesia Physical ?Anesthesia Plan ? ?ASA: 3 ? ?Anesthesia Plan: MAC and Regional  ? ?Post-op Pain Management: Tylenol PO (pre-op)* and Minimal or no pain anticipated  ? ?Induction: Intravenous ? ?PONV Risk Score and Plan: 1 and Propofol infusion and Ondansetron ? ?Airway Management Planned: Natural Airway and Simple Face Mask ? ?Additional Equipment:  ? ?Intra-op Plan:  ? ?Post-operative Plan:  ? ?Informed Consent: I have reviewed the patients History and Physical, chart, labs and discussed the procedure including the risks, benefits and alternatives for the proposed anesthesia with the patient or authorized representative who has indicated his/her understanding and acceptance.  ? ? ? ?Dental advisory given ? ?Plan Discussed with: CRNA ? ?Anesthesia Plan Comments:   ? ? ? ? ? ? ?Anesthesia Quick  Evaluation ? ?

## 2021-05-02 NOTE — Discharge Instructions (Signed)
? ?  [facility] [place] ? ?Discharge Instructions ? ?AV Fistula or Graft Surgery for Dialysis Access ? ?Please refer to the following instructions for your post-procedure care. Your surgeon or physician assistant will discuss any changes with you. ? ?Activity ? ?You may drive the day following your surgery, if you are comfortable and no longer taking prescription pain medication. Resume full activity as the soreness in your incision resolves. ? ?Bathing/Showering ? ?You may shower after you go home. Keep your incision dry for 48 hours. Do not soak in a bathtub, hot tub, or swim until the incision heals completely. You may not shower if you have a hemodialysis catheter. ? ?Incision Care ? ?Clean your incision with mild soap and water after 48 hours. Pat the area dry with a clean towel. You do not need a bandage unless otherwise instructed. Do not apply any ointments or creams to your incision. You may have skin glue on your incision. Do not peel it off. It will come off on its own in about one week. Your arm may swell a bit after surgery. To reduce swelling use pillows to elevate your arm so it is above your heart. Your doctor will tell you if you need to lightly wrap your arm with an ACE bandage. ? ?Diet ? ?Resume your normal diet. There are not special food restrictions following this procedure. In order to heal from your surgery, it is CRITICAL to get adequate nutrition. Your body requires vitamins, minerals, and protein. Vegetables are the best source of vitamins and minerals. Vegetables also provide the perfect balance of protein. Processed food has little nutritional value, so try to avoid this. ? ?Medications ? ?Resume taking all of your medications. If your incision is causing pain, you may take over-the counter pain relievers such as acetaminophen (Tylenol). If you were prescribed a stronger pain medication, please be aware these medications can cause nausea and constipation. Prevent  nausea by taking the medication with a snack or meal. Avoid constipation by drinking plenty of fluids and eating foods with high amount of fiber, such as fruits, vegetables, and grains.  ?Do not take Tylenol if you are taking prescription pain medications. ? ?Follow up ?Your surgeon may want to see you in the office following your access surgery. If so, this will be arranged at the time of your surgery. ? ?Please call us immediately for any of the following conditions: ? ?Increased pain, redness, drainage (pus) from your incision site ?Fever of 101 degrees or higher ?Severe or worsening pain at your incision site ?Hand pain or numbness. ? ?Reduce your risk of vascular disease: ? ?Stop smoking. If you would like help, call [facility] at 1-800-QUIT-NOW 9196765628) or [place] at ([phone] ? ?Manage your cholesterol ?Maintain a desired weight ?Control your diabetes ?Keep your blood pressure down ? ?Dialysis ? ?It will take several weeks to several months for your new dialysis access to be ready for use. Your surgeon will determine when it is okay to use it. Your nephrologist will continue to direct your dialysis. You can continue to use your Permcath until your new access is ready for use. ? ? ?05/02/2021 ?Timothy Lane ?[id number] ?06-07-1950 ? ?Surgeon(s): ?[name], MD ? ?Procedure(s): ?RIGHT ARM BRACHIOCEPHALIC ARTERIOVENOUS FISTULA CREATION ? ?x Do not stick fistula for 12 weeks  ? ? ?If you have any questions, please call the office at [phone]. ? ?

## 2021-05-02 NOTE — Anesthesia Postprocedure Evaluation (Signed)
Anesthesia Post Note ? ?Patient: Timothy Lane ? ?Procedure(s) Performed: RIGHT ARM BRACHIOCEPHALIC ARTERIOVENOUS FISTULA CREATION (Right: Arm Upper) ? ?  ? ?Patient location during evaluation: PACU ?Anesthesia Type: Regional ?Level of consciousness: awake and alert ?Pain management: pain level controlled ?Vital Signs Assessment: post-procedure vital signs reviewed and stable ?Respiratory status: spontaneous breathing, nonlabored ventilation and respiratory function stable ?Cardiovascular status: stable and blood pressure returned to baseline ?Postop Assessment: no apparent nausea or vomiting ?Anesthetic complications: no ? ? ?No notable events documented. ? ?Last Vitals:  ?Vitals:  ? 05/02/21 1030 05/02/21 1045  ?BP: (!) 148/71 (!) 162/93  ?Pulse: (!) 51 (!) 54  ?Resp: 11 11  ?Temp: 36.4 ?C 36.6 ?C  ?SpO2: 95% 98%  ?  ?Last Pain:  ?Vitals:  ? 05/02/21 1045  ?TempSrc:   ?PainSc: 0-No pain  ? ? ?  ?  ?  ?  ?  ?  ? ?Jaleel Allen,W. EDMOND ? ? ? ? ?

## 2021-05-02 NOTE — Progress Notes (Signed)
1515 Patient requested that daughter be called patient states "I will stay with her, I am going home" ?Candice called and states "patient can spend the rest of day with her. ?Call made to [facility] for discharge home order.   ?

## 2021-05-02 NOTE — Progress Notes (Signed)
Patient resting quietly in recliner chair with eyes closed continues to await room assignment ?

## 2021-05-02 NOTE — Progress Notes (Signed)
Patient is alert talkative skin warm and dry respirations even unlabored alert oriented x 3 awaiting room assignment patient is eating lunch at this time patient did ambulate to bathroom with out assistance ?

## 2021-05-02 NOTE — Op Note (Signed)
DATE OF SERVICE: 05/02/2021 ? ?PATIENT:  Timothy Lane  72 y.o. male ? ?PRE-OPERATIVE DIAGNOSIS:  ESRD ? ?POST-OPERATIVE DIAGNOSIS:  Same ? ?PROCEDURE:   ?right brachiocephalic arteriovenous fistula creation ? ?SURGEON:  Surgeon(s) and Role: ?   * [name], MD - Primary ? ?ASSISTANT: Leontine Locket, PA-C ? ?An experienced assistant was required given the complexity of this procedure and the standard of surgical care. My assistant helped with exposure through counter tension, suctioning, ligation and retraction to better visualize the surgical field.  My assistant expedited sewing during the case by following my sutures. Wherever I use the term "we" in the report, my assistant actively helped me with that portion of the procedure. ? ?ANESTHESIA:   regional and MAC ? ?EBL: minimal ? ?BLOOD ADMINISTERED:none ? ?DRAINS: none  ? ?LOCAL MEDICATIONS USED:  NONE ? ?SPECIMEN:  none ? ?COUNTS: confirmed correct. ? ?TOURNIQUET:  none ? ?PATIENT DISPOSITION:  PACU - hemodynamically stable. ?  ?Delay start of Pharmacological VTE agent (>24hrs) due to surgical blood loss or risk of bleeding: no ? ?INDICATION FOR PROCEDURE: Timothy Lane is a 71 y.o. male with ESRD in need of permanent dialysis access. After careful discussion of risks, benefits, and alternatives the patient was offered right brachiocephalic arteriovenous fistula creation. The patient understood and wished to proceed. ? ?OPERATIVE FINDINGS: healthy brachial artery and cephalic vein. Good doppler bruit at completion. Good radial artery signal at completion. ? ?DESCRIPTION OF PROCEDURE: After identification of the patient in the pre-operative holding area, the patient was transferred to the operating room. The patient was positioned supine on the operating room table. Anesthesia was induced. The right arm was prepped and draped in standard fashion. A surgical pause was performed confirming correct patient, procedure, and operative location. ? ?Using  intraoperative ultrasound, the course of the right upper extremity superficial veins was mapped.  The cephalic vein appeared adequate for arteriovenous fistula creation.  The brachial artery was similarly mapped.  The artery appeared adequate for arterial venous creation. We ensured there was no anomalous arterial anatomy such as a high bifurcation. ? ?A transverse incision was made in the right arm just distal to the antecubital crease.  Incision was carried down through subcutaneous tissue until the cephalic vein was identified and skeletonized.  We continued our exposure through the aponeurosis of the biceps.  The brachial artery was encountered its usual position.  The artery was circumferentially exposed and encircled with Silastic Vesseloops. ? ?Patient was systemically heparinized.  The distal cephalic vein was transected.  The distal stump of the cephalic vein was oversewn with a 2-0 silk suture.  The proximal vein was controlled with a bulldog clamp.  The brachial artery was clamped proximally medially.  An anterior arteriotomy was made with a 11 blade.  The arteriotomy was extended with Potts scissors.  Using a parachute technique the cephalic vein was anastomosed to the brachial arteriotomy in end-to-side fashion with continuous running suture of 6-0 Prolene.  Immediately prior to completion the anastomosis was flushed and de-aired.  The anastomosis was completed.  Hemostasis was assured. ? ?The fistula was interrogated with Doppler. Audible bruit was heard throughout the course of the cephalic vein.  A radial artery signal was heard which augmented slightly with compression of the fistula. ? ?Upon completion of the case instrument and sharps counts were confirmed correct. The patient was transferred to the PACU in good condition. I was present for all portions of the procedure. ? ?Timothy Lane. Stanford Breed, MD ?  [facility] [place] ?Office Phone Number: [phone] ?05/02/2021 10:45  AM ? ? ? ?

## 2021-05-02 NOTE — Anesthesia Procedure Notes (Signed)
Anesthesia Regional Block: Supraclavicular block  ? ?Pre-Anesthetic Checklist: , timeout performed,  Correct Patient, Correct Site, Correct Laterality,  Correct Procedure, Correct Position, site marked,  Risks and benefits discussed,  Pre-op evaluation,  At surgeon's request and post-op pain management ? ?Laterality: Right ? ?Prep: Maximum Sterile Barrier Precautions used, chloraprep     ?  ?Needles:  ?Injection technique: Single-shot ? ?Needle Type: Echogenic Stimulator Needle   ? ? ?Needle Length: 9cm  ?Needle Gauge: 21  ? ? ? ?Additional Needles: ? ? ?Procedures:,,,, ultrasound used (permanent image in chart),,    ?Narrative:  ?Start time: 05/02/2021 8:35 AM ?End time: 05/02/2021 8:45 AM ?Injection made incrementally with aspirations every 5 mL. ? ?Performed by: Personally  ?Anesthesiologist: Roderic Palau, MD ? ?Additional Notes: ?Intercostobrachial block with 10cc of 0.25% Bupivicaine plain.  ? ? ? ? ?

## 2021-05-02 NOTE — Interval H&P Note (Signed)
History and Physical Interval Note: ? ?05/02/2021 ?8:50 AM ? ?Timothy Lane  has presented today for surgery, with the diagnosis of END STAGE RENAL DISEASE.  The various methods of treatment have been discussed with the patient and family. After consideration of risks, benefits and other options for treatment, the patient has consented to  Procedure(s) with comments: ?RIGHT ARM BRACHIOCEPHALIC ARTERIOVENOUS FISTULA CREATION (Right) - PERIPHERAL NERVE BLOCK as a surgical intervention.  The patient's history has been reviewed, patient examined, no change in status, stable for surgery.  I have reviewed the patient's chart and labs.  Questions were answered to the patient's satisfaction.   ? ? ?[name] ? ? ?

## 2021-05-03 ENCOUNTER — Encounter (HOSPITAL_COMMUNITY): Payer: Self-pay | Admitting: Vascular Surgery

## 2021-05-07 NOTE — Discharge Summary (Signed)
?Discharge Summary ? ? ? ?Timothy Lane ?Nov 09, 1950 71 y.o. male ? ?[id number] ? ?Admission Date ?05/02/2021 ? ?Discharge Date: ?05/02/2021 ? ?Physician: ?No att. providers found ? ?Admission Diagnosis: ?ESRD (end stage renal disease) ([place]) [N18.6] ? ? ?HPI:   ?This is a 71 y.o. male with ESRD ? ?Hospital Course:  ?The patient was admitted to the hospital and taken to the operating room on 05/02/2021 and underwent: ?Right BC AVF   ? ?Pt discharged home. ? ?CBC ?   ?Component Value Date/Time  ? WBC 4.4 09/11/2011 0611  ? RBC 2.29 (L) 09/11/2011 2694  ? HGB 11.2 (L) 05/02/2021 0810  ? HCT 33.0 (L) 05/02/2021 0810  ? PLT 84 (L) 09/11/2011 8546  ? MCV 86.0 09/11/2011 0611  ? MCH 30.6 09/11/2011 0611  ? MCHC 35.5 09/11/2011 0611  ? RDW 14.1 09/11/2011 0611  ? LYMPHSABS 1.1 09/04/2011 1630  ? MONOABS 0.3 09/04/2011 1630  ? EOSABS 0.7 09/04/2011 1630  ? BASOSABS 0.0 09/04/2011 1630  ? ? ?BMET ?   ?Component Value Date/Time  ? NA 136 05/02/2021 0810  ? K 4.3 05/02/2021 0810  ? CL 97 (L) 05/02/2021 0810  ? CO2 30 09/11/2011 0611  ? GLUCOSE 157 (H) 05/02/2021 0810  ? BUN 32 (H) 05/02/2021 0810  ? CREATININE 8.20 (H) 05/02/2021 0810  ? CALCIUM 8.2 (L) 09/11/2011 2703  ? GFRNONAA 61 (L) 09/11/2011 5009  ? GFRAA 71 (L) 09/11/2011 3818  ? ? ? ? ?Discharge Instructions   ? ? Discharge patient   Complete by: As directed ?  ? Discharge disposition: 01-Home or Self Care  ? Discharge patient date: 05/02/2021  ? Discharge patient   Complete by: As directed ?  ? Discharge pt at 4:30am on 05/03/2021 so he can make his dialysis appt.  ? Discharge disposition: 01-Home or Self Care  ? Discharge patient date: 05/03/2021  ? ?  ? ? ?Discharge Diagnosis:  ?ESRD (end stage renal disease) ([place]) [N18.6] ? ?Secondary Diagnosis: ?Patient Active Problem List  ? Diagnosis Date Noted  ? ESRD (end stage renal disease) ([place]) 09/13/2020  ? S/P total knee arthroplasty 09/10/2011  ? Tobacco abuse 09/10/2011  ? 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  HTN (hypertension) 09/10/2011  ? Hepatitis C 09/10/2011  ? ?Past Medical History:  ?Diagnosis Date  ? Anemia   ? Arthritis   ? Asthma   ? Bronchitis   ? Chronic kidney disease   ? Mon-Wed-Fri  ? Diabetes mellitus   ? type 2  ? Hepatitis   ? hepatitis C  ? Hypertension   ? Stomach ulcer   ? ? ? ?Allergies as of 05/02/2021   ? ?   Reactions  ? Latex Rash  ? ?  ? ?  ?Medication List  ?  ? ?TAKE these medications   ? ?allopurinol 100 MG tablet ?Commonly known as: ZYLOPRIM ?Take 100 mg by mouth daily. ?  ?amLODipine 10 MG tablet ?Commonly known as: NORVASC ?Take 10 mg by mouth daily. ?  ?calcium acetate 667 MG capsule ?Commonly known as: PHOSLO ?Take 2,001 mg by mouth 3 (three) times daily with meals. ?  ?folic acid-vitamin b complex-vitamin c-selenium-zinc 3 MG Tabs tablet ?Take 1 tablet by mouth daily. ?  ?gabapentin 100 MG capsule ?Commonly known as: NEURONTIN ?Take 100 mg by mouth 3 (three) times daily. ?  ?hydrALAZINE 25 MG tablet ?Commonly known as: APRESOLINE ?Take 25 mg by mouth 3 (three) times daily. ?  ?insulin NPH-regular Human (  70-30) 100 UNIT/ML injection ?Inject 18-23 Units into the skin See admin instructions. 23 units in the morning ?18units at night ?  ?loperamide 2 MG capsule ?Commonly known as: IMODIUM ?Take 2 mg by mouth in the morning, at noon, and at bedtime. ?  ?metoprolol tartrate 50 MG tablet ?Commonly known as: LOPRESSOR ?Take 50 mg by mouth 2 (two) times daily. ?  ?omeprazole 20 MG capsule ?Commonly known as: PRILOSEC ?Take 20 mg by mouth 2 (two) times daily before a meal. ?  ?oxyCODONE-acetaminophen 5-325 MG tablet ?Commonly known as: Percocet ?Take 1 tablet by mouth every 6 (six) hours as needed for severe pain. ?  ? ?  ? ? ? ? ? ?Leontine Locket, PA-C ?[facility] ?[phone] ?05/07/2021  ?6:44 AM  ?

## 2021-06-04 ENCOUNTER — Other Ambulatory Visit: Payer: Self-pay

## 2021-06-04 ENCOUNTER — Other Ambulatory Visit: Payer: Self-pay | Admitting: *Deleted

## 2021-06-04 DIAGNOSIS — N186 End stage renal disease: Secondary | ICD-10-CM

## 2021-06-18 ENCOUNTER — Encounter: Payer: Self-pay | Admitting: Physician Assistant

## 2021-06-18 ENCOUNTER — Ambulatory Visit (INDEPENDENT_AMBULATORY_CARE_PROVIDER_SITE_OTHER): Payer: No Typology Code available for payment source | Admitting: Physician Assistant

## 2021-06-18 ENCOUNTER — Other Ambulatory Visit: Payer: Self-pay

## 2021-06-18 ENCOUNTER — Ambulatory Visit (HOSPITAL_COMMUNITY)
Admission: RE | Admit: 2021-06-18 | Discharge: 2021-06-18 | Disposition: A | Discharge status: Home / Self Care | Payer: No Typology Code available for payment source | Source: Ambulatory Visit | Attending: Physician Assistant | Admitting: Physician Assistant

## 2021-06-18 VITALS — BP 112/64 | HR 59 | Temp 97.9°F | Resp 20 | Ht 73.0 in | Wt 213.4 lb

## 2021-06-18 DIAGNOSIS — N186 End stage renal disease: Secondary | ICD-10-CM | POA: Diagnosis not present

## 2021-06-18 DIAGNOSIS — Z992 Dependence on renal dialysis: Secondary | ICD-10-CM | POA: Insufficient documentation

## 2021-06-18 NOTE — Progress Notes (Signed)
?POST OPERATIVE OFFICE NOTE ? ? ? ?CC:  F/u for surgery ? ?HPI:  This is a 71 y.o. male who is s/p right BC AV fistula on 05/02/21 by Dr. Stanford Breed.  He had a previous interposition revision of his left upper extremity graft has failed.  He has had multiple left UE access procedures to date.  He is on HD MWF via right TDC placed by Dr. Stanford Breed > 6 months ago.  ? He denies steal symptoms in the right hand.  No numbness, pain or motor loss.  ? ? ? ? ?Allergies  ?Allergen Reactions  ? Latex Rash  ? ? ?Current Outpatient Medications  ?Medication Sig Dispense Refill  ? allopurinol (ZYLOPRIM) 100 MG tablet Take 100 mg by mouth daily.    ? amLODipine (NORVASC) 10 MG tablet Take 10 mg by mouth daily.    ? calcium acetate (PHOSLO) 667 MG capsule Take 2,001 mg by mouth 3 (three) times daily with meals.    ? folic acid-vitamin b complex-vitamin c-selenium-zinc (DIALYVITE) 3 MG TABS tablet Take 1 tablet by mouth daily.    ? gabapentin (NEURONTIN) 100 MG capsule Take 100 mg by mouth 3 (three) times daily.    ? hydrALAZINE (APRESOLINE) 25 MG tablet Take 25 mg by mouth 3 (three) times daily.    ? insulin NPH-insulin regular (NOVOLIN 70/30) (70-30) 100 UNIT/ML injection Inject 18-23 Units into the skin See admin instructions. 23 units in the morning ?18units at night    ? loperamide (IMODIUM) 2 MG capsule Take 2 mg by mouth in the morning, at noon, and at bedtime.    ? metoprolol tartrate (LOPRESSOR) 50 MG tablet Take 50 mg by mouth 2 (two) times daily.    ? omeprazole (PRILOSEC) 20 MG capsule Take 20 mg by mouth 2 (two) times daily before a meal.    ? oxyCODONE-acetaminophen (PERCOCET) 5-325 MG tablet Take 1 tablet by mouth every 6 (six) hours as needed for severe pain. 8 tablet 0  ? ?No current facility-administered medications for this visit.  ? ? ? ROS:  See HPI ? ?Physical Exam: ? ?   ?Findings:  ?+--------------------+----------+-----------------+--------+  ?AVF                 PSV (cm/s)Flow Vol (mL/min)Comments   ?+--------------------+----------+-----------------+--------+  ?Native artery inflow   126           822                 ?+--------------------+----------+-----------------+--------+  ?AVF Anastomosis        225                               ?+--------------------+----------+-----------------+--------+  ? ?   ?+------------+----------+-------------+----------+-------------------------  ?----+  ?OUTFLOW VEINPSV (cm/s)Diameter (cm)Depth (cm)          Describe        ?      ?+------------+----------+-------------+----------+-------------------------  ?----+  ?Shoulder       209        0.55        0.52                             ?      ?+------------+----------+-------------+----------+-------------------------  ?----+  ?Prox UA        148        0.46        0.44  competing branch 0.19  ?cm 24   ?                                                         cm/s          ?      ?+------------+----------+-------------+----------+-------------------------  ?----+  ?Mid UA      135 / 233     0.46        0.33    competing branch 0.20  ?cm &    ?                                                       156 cm/s        ?      ?+------------+----------+-------------+----------+-------------------------  ?----+  ?Dist UA        206        0.62        0.24                             ?      ?+------------+----------+-------------+----------+-------------------------  ?----+  ?AC Fossa       711        0.21        0.32   change in Diameter 1.3  ?cm in   ?                                                        length          ?      ?+------------+----------+-------------+----------+-------------------------  ?----+  ? ? ?Summary:  ?Patent right Brachial Cephalic AVF with increased velocity at area of  ?narrowing in the antecubital fossa measuring approximately 1.3 cms in  ?length.  ? ?Incision:  well healed ?Extremities:  palpable thrill at anastomosis, radial pulse  palpable , grip 5/5 and sensation to light touch intact. ? ? ? ? ?Assessment/Plan:  This is a 71 y.o. male who is s/p:right BC AV fistula ?The duplex shows the depth is < 0.6 cm however the diameter is < 0.6 cm with 3 areas of competing branches.  I have schedule him for branch ligation of the competing branches with DR. Hawken.  He is not on anticoagulation and has HD MWF.   ? ? ?Roxy Horseman ? PAC ?[facility] ?[phone] ? ? ?Clinic MD:  Stanford Breed ?

## 2021-06-18 NOTE — H&P (View-Only) (Signed)
POST OPERATIVE OFFICE NOTE    CC:  F/u for surgery  HPI:  This is a 71 y.o. male who is s/p right BC AV fistula on 05/02/21 by Dr. Stanford Breed.  He had a previous interposition revision of his left upper extremity graft has failed.  He has had multiple left UE access procedures to date.  He is on HD MWF via right TDC placed by Dr. Stanford Breed > 6 months ago.   He denies steal symptoms in the right hand.  No numbness, pain or motor loss.      Allergies  Allergen Reactions   Latex Rash    Current Outpatient Medications  Medication Sig Dispense Refill   allopurinol (ZYLOPRIM) 100 MG tablet Take 100 mg by mouth daily.     amLODipine (NORVASC) 10 MG tablet Take 10 mg by mouth daily.     calcium acetate (PHOSLO) 667 MG capsule Take 2,001 mg by mouth 3 (three) times daily with meals.     folic acid-vitamin b complex-vitamin c-selenium-zinc (DIALYVITE) 3 MG TABS tablet Take 1 tablet by mouth daily.     gabapentin (NEURONTIN) 100 MG capsule Take 100 mg by mouth 3 (three) times daily.     hydrALAZINE (APRESOLINE) 25 MG tablet Take 25 mg by mouth 3 (three) times daily.     insulin NPH-insulin regular (NOVOLIN 70/30) (70-30) 100 UNIT/ML injection Inject 18-23 Units into the skin See admin instructions. 23 units in the morning 18units at night     loperamide (IMODIUM) 2 MG capsule Take 2 mg by mouth in the morning, at noon, and at bedtime.     metoprolol tartrate (LOPRESSOR) 50 MG tablet Take 50 mg by mouth 2 (two) times daily.     omeprazole (PRILOSEC) 20 MG capsule Take 20 mg by mouth 2 (two) times daily before a meal.     oxyCODONE-acetaminophen (PERCOCET) 5-325 MG tablet Take 1 tablet by mouth every 6 (six) hours as needed for severe pain. 8 tablet 0   No current facility-administered medications for this visit.     ROS:  See HPI  Physical Exam:     Findings:  +--------------------+----------+-----------------+--------+  AVF                 PSV (cm/s)Flow Vol (mL/min)Comments   +--------------------+----------+-----------------+--------+  Native artery inflow   126           822                 +--------------------+----------+-----------------+--------+  AVF Anastomosis        225                               +--------------------+----------+-----------------+--------+      +------------+----------+-------------+----------+-------------------------  ----+  OUTFLOW VEINPSV (cm/s)Diameter (cm)Depth (cm)          Describe              +------------+----------+-------------+----------+-------------------------  ----+  Shoulder       209        0.55        0.52                                   +------------+----------+-------------+----------+-------------------------  ----+  Prox UA        148        0.46        0.44  competing branch 0.19  cm 24                                                            cm/s                +------------+----------+-------------+----------+-------------------------  ----+  Mid UA      135 / 233     0.46        0.33    competing branch 0.20  cm &                                                           156 cm/s              +------------+----------+-------------+----------+-------------------------  ----+  Dist UA        206        0.62        0.24                                   +------------+----------+-------------+----------+-------------------------  ----+  AC Fossa       711        0.21        0.32   change in Diameter 1.3  cm in                                                           length                +------------+----------+-------------+----------+-------------------------  ----+    Summary:  Patent right Brachial Cephalic AVF with increased velocity at area of  narrowing in the antecubital fossa measuring approximately 1.3 cms in  length.   Incision:  well healed Extremities:  palpable thrill at anastomosis, radial pulse  palpable , grip 5/5 and sensation to light touch intact.     Assessment/Plan:  This is a 71 y.o. male who is s/p:right BC AV fistula The duplex shows the depth is < 0.6 cm however the diameter is < 0.6 cm with 3 areas of competing branches.  I have schedule him for branch ligation of the competing branches with DR. Hawken.  He is not on anticoagulation and has HD MWF.     Roxy Horseman  [facility] [phone]   Clinic MD:  Stanford Breed

## 2021-06-26 ENCOUNTER — Encounter (HOSPITAL_COMMUNITY): Payer: Self-pay | Admitting: Vascular Surgery

## 2021-06-26 MED ORDER — ONDANSETRON HCL 4 MG/2ML IJ SOLN
INTRAMUSCULAR | Status: AC
  Filled 2021-06-26: qty 2

## 2021-06-26 MED ORDER — ARTIFICIAL TEARS OPHTHALMIC OINT
TOPICAL_OINTMENT | OPHTHALMIC | Status: AC
  Filled 2021-06-26: qty 3.5

## 2021-06-26 MED ORDER — SODIUM CHLORIDE (PF) 0.9 % IJ SOLN
INTRAMUSCULAR | Status: AC
  Filled 2021-06-26: qty 10

## 2021-06-26 MED ORDER — SUCCINYLCHOLINE CHLORIDE 200 MG/10ML IV SOSY
PREFILLED_SYRINGE | INTRAVENOUS | Status: AC
  Filled 2021-06-26: qty 10

## 2021-06-26 MED ORDER — ROCURONIUM BROMIDE 10 MG/ML (PF) SYRINGE
PREFILLED_SYRINGE | INTRAVENOUS | Status: AC
  Filled 2021-06-26: qty 10

## 2021-06-26 MED ORDER — LIDOCAINE 2% (20 MG/ML) 5 ML SYRINGE
INTRAMUSCULAR | Status: AC
  Filled 2021-06-26: qty 5

## 2021-06-26 MED ORDER — GLYCOPYRROLATE PF 0.2 MG/ML IJ SOSY
PREFILLED_SYRINGE | INTRAMUSCULAR | Status: AC
  Filled 2021-06-26: qty 1

## 2021-06-26 MED ORDER — NEOSTIGMINE METHYLSULFATE 3 MG/3ML IV SOSY
PREFILLED_SYRINGE | INTRAVENOUS | Status: AC
  Filled 2021-06-26: qty 3

## 2021-06-26 NOTE — Progress Notes (Signed)
TWO VISITORS ARE ALLOWED TO COME WITH YOU AND STAY IN THE SURGICAL WAITING ROOM ONLY DURING PRE OP AND PROCEDURE DAY OF SURGERY.   PCP - [facility] Cardiologist - n/a Nephrology - [place] -Dr Dora Sims  Chest x-ray - 09/13/20 (1V) EKG - 05/02/21 Stress Test - n/a ECHO - n/a Cardiac Cath - n/a  ICD Pacemaker/Loop - n/a  Sleep Study -  n/a CPAP - none  Diabetes - Patient does not check blood sugar. THE NIGHT BEFORE SURGERY, take 13 units of Novolog Mix 70/30 Insulin.     THE MORNING OF SURGERY, do not take Novolog Mix 70/30 Insulin.  Anesthesia review: Yes  STOP now taking any Aspirin (unless otherwise instructed by your surgeon), Aleve, Naproxen, Ibuprofen, Motrin, Advil, Goody's, BC's, all herbal medications, fish oil, and all vitamins.   Coronavirus Screening Covid Test n/a Ambulatory Surgery Do you have any of the following symptoms:  Cough yes/no: No Fever (>100.23F)  yes/no: No Runny nose yes/no: No Sore throat yes/no: No Difficulty breathing/shortness of breath  yes/no: No  Have you traveled in the last 14 days and where? yes/no: No  Patient verbalized understanding of instructions that were given via phone.

## 2021-06-27 ENCOUNTER — Encounter (HOSPITAL_COMMUNITY)
Admission: RE | Disposition: A | Discharge status: Home / Self Care | Payer: Self-pay | Source: Ambulatory Visit | Attending: Vascular Surgery

## 2021-06-27 ENCOUNTER — Encounter (HOSPITAL_COMMUNITY): Payer: Self-pay | Admitting: Vascular Surgery

## 2021-06-27 ENCOUNTER — Other Ambulatory Visit: Payer: Self-pay

## 2021-06-27 ENCOUNTER — Ambulatory Visit (HOSPITAL_BASED_OUTPATIENT_CLINIC_OR_DEPARTMENT_OTHER): Payer: No Typology Code available for payment source | Admitting: Physician Assistant

## 2021-06-27 ENCOUNTER — Observation Stay (HOSPITAL_COMMUNITY)
Admission: RE | Admit: 2021-06-27 | Discharge: 2021-06-27 | Disposition: A | Discharge status: Home / Self Care | Payer: No Typology Code available for payment source | Source: Ambulatory Visit | Attending: Vascular Surgery | Admitting: Vascular Surgery

## 2021-06-27 ENCOUNTER — Ambulatory Visit (HOSPITAL_COMMUNITY): Payer: No Typology Code available for payment source | Admitting: Physician Assistant

## 2021-06-27 DIAGNOSIS — T82590A Other mechanical complication of surgically created arteriovenous fistula, initial encounter: Secondary | ICD-10-CM

## 2021-06-27 DIAGNOSIS — Z91040 Latex allergy status: Secondary | ICD-10-CM | POA: Insufficient documentation

## 2021-06-27 DIAGNOSIS — E1122 Type 2 diabetes mellitus with diabetic chronic kidney disease: Secondary | ICD-10-CM | POA: Diagnosis not present

## 2021-06-27 DIAGNOSIS — Z992 Dependence on renal dialysis: Secondary | ICD-10-CM | POA: Insufficient documentation

## 2021-06-27 DIAGNOSIS — T82898A Other specified complication of vascular prosthetic devices, implants and grafts, initial encounter: Secondary | ICD-10-CM

## 2021-06-27 DIAGNOSIS — I120 Hypertensive chronic kidney disease with stage 5 chronic kidney disease or end stage renal disease: Secondary | ICD-10-CM | POA: Diagnosis not present

## 2021-06-27 DIAGNOSIS — N186 End stage renal disease: Secondary | ICD-10-CM

## 2021-06-27 DIAGNOSIS — Z79899 Other long term (current) drug therapy: Secondary | ICD-10-CM | POA: Insufficient documentation

## 2021-06-27 DIAGNOSIS — N185 Chronic kidney disease, stage 5: Secondary | ICD-10-CM

## 2021-06-27 HISTORY — PX: LIGATION OF COMPETING BRANCHES OF ARTERIOVENOUS FISTULA: SHX5949

## 2021-06-27 LAB — POCT I-STAT, CHEM 8
BUN: 40 mg/dL — ABNORMAL HIGH (ref 8–23)
BUN: 41 mg/dL — ABNORMAL HIGH (ref 8–23)
Calcium, Ion: 0.77 mmol/L — CL (ref 1.15–1.40)
Calcium, Ion: 0.99 mmol/L — ABNORMAL LOW (ref 1.15–1.40)
Chloride: 100 mmol/L (ref 98–111)
Chloride: 96 mmol/L — ABNORMAL LOW (ref 98–111)
Creatinine, Ser: 8.5 mg/dL — ABNORMAL HIGH (ref 0.61–1.24)
Creatinine, Ser: 8.6 mg/dL — ABNORMAL HIGH (ref 0.61–1.24)
Glucose, Bld: 151 mg/dL — ABNORMAL HIGH (ref 70–99)
Glucose, Bld: 157 mg/dL — ABNORMAL HIGH (ref 70–99)
HCT: 33 % — ABNORMAL LOW (ref 39.0–52.0)
HCT: 34 % — ABNORMAL LOW (ref 39.0–52.0)
Hemoglobin: 11.2 g/dL — ABNORMAL LOW (ref 13.0–17.0)
Hemoglobin: 11.6 g/dL — ABNORMAL LOW (ref 13.0–17.0)
Potassium: 4.6 mmol/L (ref 3.5–5.1)
Potassium: 5.3 mmol/L — ABNORMAL HIGH (ref 3.5–5.1)
Sodium: 132 mmol/L — ABNORMAL LOW (ref 135–145)
Sodium: 135 mmol/L (ref 135–145)
TCO2: 28 mmol/L (ref 22–32)
TCO2: 31 mmol/L (ref 22–32)

## 2021-06-27 LAB — GLUCOSE, CAPILLARY
Glucose-Capillary: 153 mg/dL — ABNORMAL HIGH (ref 70–99)
Glucose-Capillary: 161 mg/dL — ABNORMAL HIGH (ref 70–99)

## 2021-06-27 SURGERY — LIGATION OF COMPETING BRANCHES OF ARTERIOVENOUS FISTULA
Anesthesia: Monitor Anesthesia Care | Site: Arm Upper | Laterality: Right

## 2021-06-27 MED ORDER — HEPARIN 6000 UNIT IRRIGATION SOLUTION
Status: AC
  Filled 2021-06-27: qty 500

## 2021-06-27 MED ORDER — HYDROCODONE-ACETAMINOPHEN 5-325 MG PO TABS: 1.0000 | ORAL_TABLET | ORAL | Status: DC | PRN

## 2021-06-27 MED ORDER — PANTOPRAZOLE SODIUM 40 MG PO TBEC: 40.0000 mg | DELAYED_RELEASE_TABLET | Freq: Every day | ORAL | Status: DC

## 2021-06-27 MED ORDER — SODIUM CHLORIDE 0.9 % IV SOLN: INTRAVENOUS | Status: DC

## 2021-06-27 MED ORDER — ONDANSETRON HCL 4 MG/2ML IJ SOLN
INTRAMUSCULAR | Status: AC
  Filled 2021-06-27: qty 2

## 2021-06-27 MED ORDER — METOPROLOL TARTRATE 5 MG/5ML IV SOLN: 2.0000 mg | INTRAVENOUS | Status: DC | PRN

## 2021-06-27 MED ORDER — GUAIFENESIN-DM 100-10 MG/5ML PO SYRP: 15.0000 mL | ORAL_SOLUTION | ORAL | Status: DC | PRN

## 2021-06-27 MED ORDER — LIDOCAINE-EPINEPHRINE (PF) 1 %-1:200000 IJ SOLN
INTRAMUSCULAR | Status: DC | PRN
  Administered 2021-06-27: 7 mL

## 2021-06-27 MED ORDER — INSULIN ASPART 100 UNIT/ML IJ SOLN: 0.0000 [IU] | INTRAMUSCULAR | Status: DC | PRN

## 2021-06-27 MED ORDER — OXYCODONE HCL 5 MG PO TABS: 5.0000 mg | ORAL_TABLET | ORAL | Status: DC | PRN

## 2021-06-27 MED ORDER — ORAL CARE MOUTH RINSE: 15.0000 mL | Freq: Once | OROMUCOSAL | Status: AC

## 2021-06-27 MED ORDER — LIDOCAINE-EPINEPHRINE 1 %-1:100000 IJ SOLN
INTRAMUSCULAR | Status: AC
  Filled 2021-06-27: qty 1

## 2021-06-27 MED ORDER — PHENOL 1.4 % MT LIQD: 1.0000 | OROMUCOSAL | Status: DC | PRN

## 2021-06-27 MED ORDER — HEPARIN 6000 UNIT IRRIGATION SOLUTION
Status: DC | PRN
  Administered 2021-06-27: 1

## 2021-06-27 MED ORDER — CEFAZOLIN SODIUM-DEXTROSE 2-4 GM/100ML-% IV SOLN
2.0000 g | INTRAVENOUS | Status: AC
  Administered 2021-06-27: 2 g via INTRAVENOUS
  Filled 2021-06-27: qty 100

## 2021-06-27 MED ORDER — CHLORHEXIDINE GLUCONATE 4 % EX LIQD: 60.0000 mL | Freq: Once | CUTANEOUS | Status: DC

## 2021-06-27 MED ORDER — PROPOFOL 500 MG/50ML IV EMUL
INTRAVENOUS | Status: DC | PRN
  Administered 2021-06-27: 55 ug/kg/min via INTRAVENOUS

## 2021-06-27 MED ORDER — ALUM & MAG HYDROXIDE-SIMETH 200-200-20 MG/5ML PO SUSP: 15.0000 mL | ORAL | Status: DC | PRN

## 2021-06-27 MED ORDER — FENTANYL CITRATE (PF) 250 MCG/5ML IJ SOLN
INTRAMUSCULAR | Status: DC | PRN
  Administered 2021-06-27 (×2): 50 ug via INTRAVENOUS
  Administered 2021-06-27 (×3): 25 ug via INTRAVENOUS

## 2021-06-27 MED ORDER — ACETAMINOPHEN 325 MG RE SUPP: 325.0000 mg | RECTAL | Status: DC | PRN

## 2021-06-27 MED ORDER — 0.9 % SODIUM CHLORIDE (POUR BTL) OPTIME
TOPICAL | Status: DC | PRN
  Administered 2021-06-27: 1000 mL

## 2021-06-27 MED ORDER — PROPOFOL 1000 MG/100ML IV EMUL
INTRAVENOUS | Status: AC
  Filled 2021-06-27: qty 100

## 2021-06-27 MED ORDER — LIDOCAINE-EPINEPHRINE (PF) 1 %-1:200000 IJ SOLN
INTRAMUSCULAR | Status: AC
  Filled 2021-06-27: qty 30

## 2021-06-27 MED ORDER — FENTANYL CITRATE (PF) 100 MCG/2ML IJ SOLN: 25.0000 ug | INTRAMUSCULAR | Status: DC | PRN

## 2021-06-27 MED ORDER — ACETAMINOPHEN 500 MG PO TABS: 1000.0000 mg | ORAL_TABLET | Freq: Once | ORAL | Status: DC | PRN

## 2021-06-27 MED ORDER — ONDANSETRON HCL 4 MG/2ML IJ SOLN
INTRAMUSCULAR | Status: DC | PRN
  Administered 2021-06-27: 4 mg via INTRAVENOUS

## 2021-06-27 MED ORDER — METOPROLOL TARTRATE 50 MG PO TABS
50.0000 mg | ORAL_TABLET | Freq: Once | ORAL | Status: AC
  Administered 2021-06-27: 50 mg via ORAL
  Filled 2021-06-27: qty 1

## 2021-06-27 MED ORDER — POTASSIUM CHLORIDE CRYS ER 20 MEQ PO TBCR: 20.0000 meq | EXTENDED_RELEASE_TABLET | Freq: Once | ORAL | Status: DC

## 2021-06-27 MED ORDER — ACETAMINOPHEN 325 MG PO TABS: 325.0000 mg | ORAL_TABLET | ORAL | Status: DC | PRN

## 2021-06-27 MED ORDER — FENTANYL CITRATE (PF) 250 MCG/5ML IJ SOLN
INTRAMUSCULAR | Status: AC
  Filled 2021-06-27: qty 5

## 2021-06-27 MED ORDER — ACETAMINOPHEN 160 MG/5ML PO SOLN: 1000.0000 mg | Freq: Once | ORAL | Status: DC | PRN

## 2021-06-27 MED ORDER — CHLORHEXIDINE GLUCONATE 0.12 % MT SOLN
15.0000 mL | Freq: Once | OROMUCOSAL | Status: AC
  Administered 2021-06-27: 15 mL via OROMUCOSAL
  Filled 2021-06-27: qty 15

## 2021-06-27 MED ORDER — HEPARIN SODIUM (PORCINE) 5000 UNIT/ML IJ SOLN: 5000.0000 [IU] | Freq: Three times a day (TID) | INTRAMUSCULAR | Status: DC

## 2021-06-27 MED ORDER — ACETAMINOPHEN 10 MG/ML IV SOLN: 1000.0000 mg | Freq: Once | INTRAVENOUS | Status: DC | PRN

## 2021-06-27 MED ORDER — MORPHINE SULFATE (PF) 2 MG/ML IV SOLN: 2.0000 mg | INTRAVENOUS | Status: DC | PRN

## 2021-06-27 MED ORDER — HYDROCODONE-ACETAMINOPHEN 5-325 MG PO TABS: 1.0000 | ORAL_TABLET | Freq: Four times a day (QID) | ORAL | 0 refills | Status: AC | PRN

## 2021-06-27 MED ORDER — LABETALOL HCL 5 MG/ML IV SOLN: 10.0000 mg | INTRAVENOUS | Status: DC | PRN

## 2021-06-27 MED ORDER — ONDANSETRON HCL 4 MG/2ML IJ SOLN: 4.0000 mg | Freq: Four times a day (QID) | INTRAMUSCULAR | Status: DC | PRN

## 2021-06-27 MED ORDER — PHENYLEPHRINE HCL-NACL 20-0.9 MG/250ML-% IV SOLN
INTRAVENOUS | Status: DC | PRN
  Administered 2021-06-27: 30 ug/min via INTRAVENOUS

## 2021-06-27 MED ORDER — INSULIN ASPART 100 UNIT/ML IJ SOLN: 0.0000 [IU] | Freq: Three times a day (TID) | INTRAMUSCULAR | Status: DC

## 2021-06-27 MED ORDER — HYDRALAZINE HCL 20 MG/ML IJ SOLN: 5.0000 mg | INTRAMUSCULAR | Status: DC | PRN

## 2021-06-27 SURGICAL SUPPLY — 40 items
ADH SKN CLS APL DERMABOND .7 (GAUZE/BANDAGES/DRESSINGS) ×1
APL PRP STRL LF DISP 70% ISPRP (MISCELLANEOUS) ×1
APL SKNCLS STERI-STRIP NONHPOA (GAUZE/BANDAGES/DRESSINGS)
ARMBAND PINK RESTRICT EXTREMIT (MISCELLANEOUS) ×2 IMPLANT
BAG COUNTER SPONGE SURGICOUNT (BAG) ×2 IMPLANT
BAG SPNG CNTER NS LX DISP (BAG) ×1
BENZOIN TINCTURE PRP APPL 2/3 (GAUZE/BANDAGES/DRESSINGS) ×1 IMPLANT
CANISTER SUCT 3000ML PPV (MISCELLANEOUS) ×2 IMPLANT
CANNULA VESSEL 3MM 2 BLNT TIP (CANNULA) ×1 IMPLANT
CHLORAPREP W/TINT 26 (MISCELLANEOUS) ×2 IMPLANT
CLIP LIGATING EXTRA MED SLVR (CLIP) ×1 IMPLANT
CLIP LIGATING EXTRA SM BLUE (MISCELLANEOUS) ×1 IMPLANT
COVER PROBE W GEL 5X96 (DRAPES) ×1 IMPLANT
DERMABOND ADVANCED (GAUZE/BANDAGES/DRESSINGS) ×1
DERMABOND ADVANCED .7 DNX12 (GAUZE/BANDAGES/DRESSINGS) IMPLANT
ELECT REM PT RETURN 9FT ADLT (ELECTROSURGICAL) ×2
ELECTRODE REM PT RTRN 9FT ADLT (ELECTROSURGICAL) ×1 IMPLANT
GLOVE BIOGEL PI IND STRL 7.0 (GLOVE) IMPLANT
GLOVE BIOGEL PI INDICATOR 7.0 (GLOVE) ×1
GLOVE SURG SS PI 8.0 STRL IVOR (GLOVE) ×2 IMPLANT
GOWN STRL REUS W/ TWL LRG LVL3 (GOWN DISPOSABLE) ×2 IMPLANT
GOWN STRL REUS W/ TWL XL LVL3 (GOWN DISPOSABLE) ×1 IMPLANT
GOWN STRL REUS W/TWL LRG LVL3 (GOWN DISPOSABLE) ×4
GOWN STRL REUS W/TWL XL LVL3 (GOWN DISPOSABLE) ×2
INSERT FOGARTY SM (MISCELLANEOUS) IMPLANT
KIT BASIN OR (CUSTOM PROCEDURE TRAY) ×2 IMPLANT
KIT TURNOVER KIT B (KITS) ×2 IMPLANT
NS IRRIG 1000ML POUR BTL (IV SOLUTION) ×2 IMPLANT
PACK CV ACCESS (CUSTOM PROCEDURE TRAY) ×2 IMPLANT
PAD ARMBOARD 7.5X6 YLW CONV (MISCELLANEOUS) ×4 IMPLANT
STRIP CLOSURE SKIN 1/2X4 (GAUZE/BANDAGES/DRESSINGS) ×2 IMPLANT
SUT MNCRL AB 4-0 PS2 18 (SUTURE) ×3 IMPLANT
SUT PROLENE 5 0 C 1 24 (SUTURE) ×1 IMPLANT
SUT PROLENE 6 0 BV (SUTURE) ×1 IMPLANT
SUT SILK 0 TIES 10X30 (SUTURE) ×1 IMPLANT
SUT VIC AB 3-0 SH 27 (SUTURE) ×4
SUT VIC AB 3-0 SH 27X BRD (SUTURE) ×1 IMPLANT
TOWEL GREEN STERILE (TOWEL DISPOSABLE) ×2 IMPLANT
UNDERPAD 30X36 HEAVY ABSORB (UNDERPADS AND DIAPERS) ×2 IMPLANT
WATER STERILE IRR 1000ML POUR (IV SOLUTION) ×2 IMPLANT

## 2021-06-27 NOTE — Discharge Instructions (Signed)
° °  [facility] [place] ° °Discharge Instructions ° °AV Fistula or Graft Surgery for Dialysis Access ° °Please refer to the following instructions for your post-procedure care. Your surgeon or physician assistant will discuss any changes with you. ° °Activity ° °You may drive the day following your surgery, if you are comfortable and no longer taking prescription pain medication. Resume full activity as the soreness in your incision resolves. ° °Bathing/Showering ° °You may shower after you go home. Keep your incision dry for 48 hours. Do not soak in a bathtub, hot tub, or swim until the incision heals completely. You may not shower if you have a hemodialysis catheter. ° °Incision Care ° °Clean your incision with mild soap and water after 48 hours. Pat the area dry with a clean towel. You do not need a bandage unless otherwise instructed. Do not apply any ointments or creams to your incision. You may have skin glue on your incision. Do not peel it off. It will come off on its own in about one week. Your arm may swell a bit after surgery. To reduce swelling use pillows to elevate your arm so it is above your heart. Your doctor will tell you if you need to lightly wrap your arm with an ACE bandage. ° °Diet ° °Resume your normal diet. There are not special food restrictions following this procedure. In order to heal from your surgery, it is CRITICAL to get adequate nutrition. Your body requires vitamins, minerals, and protein. Vegetables are the best source of vitamins and minerals. Vegetables also provide the perfect balance of protein. Processed food has little nutritional value, so try to avoid this. ° °Medications ° °Resume taking all of your medications. If your incision is causing pain, you may take over-the counter pain relievers such as acetaminophen (Tylenol). If you were prescribed a stronger pain medication, please be aware these medications can cause nausea and constipation. Prevent  nausea by taking the medication with a snack or meal. Avoid constipation by drinking plenty of fluids and eating foods with high amount of fiber, such as fruits, vegetables, and grains. Do not take Tylenol if you are taking prescription pain medications. ° ° ° ° °Follow up °Your surgeon may want to see you in the office following your access surgery. If so, this will be arranged at the time of your surgery. ° °Please call us immediately for any of the following conditions: ° °Increased pain, redness, drainage (pus) from your incision site °Fever of 101 degrees or higher °Severe or worsening pain at your incision site °Hand pain or numbness. ° °Reduce your risk of vascular disease: ° °Stop smoking. If you would like help, call [facility] at 1-800-QUIT-NOW (1-800-784-8669) or [place] at 336-586-4000 ° °Manage your cholesterol °Maintain a desired weight °Control your diabetes °Keep your blood pressure down ° °Dialysis ° °It will take several weeks to several months for your new dialysis access to be ready for use. Your surgeon will determine when it is OK to use it. Your nephrologist will continue to direct your dialysis. You can continue to use your Permcath until your new access is ready for use. ° °If you have any questions, please call the office at [phone]. ° °

## 2021-06-27 NOTE — Discharge Summary (Signed)
  Discharge Summary  Patient ID: Timothy Lane [id number] 71 y.o. 04/07/1950  Admit date: 06/27/2021  Discharge date and time: No discharge date for patient encounter.   Admitting Physician: [name], MD   Discharge Physician: same  Admission Diagnoses: ESRD (end stage renal disease) ([place]) [N18.6]  Discharge Diagnoses: same  Admission Condition: good  Discharged Condition: good  Indication for Admission: Observation  Hospital Course: Mr. Biran Silguero is a 71 year old male who was brought in today for revision of right arm AV fistula by sidebranch ligation x3 by Dr. Stanford Breed.  He tolerated the procedure well and was placed in observation.  A close friend was able to pick him up from the hospital and stay with him this evening thus he was discharged home.  He will follow-up in office in 6 weeks with a fistula duplex.  He was prescribed 1 day of narcotic pain medication for postoperative pain control.  He was discharged home in stable condition.  He will [facility] to [facility] for dialysis on schedule tomorrow.  Consults: None  Treatments: surgery: Right arm fistula revision by sidebranch ligation x3 by Dr. Stanford Breed 06/27/2021   Vitals:   06/27/21 1254 06/27/21 1505  BP: (!) 148/64 139/76  Pulse: 62 (!) 53  Resp: 13 18  Temp: 97.8 F (36.6 C) 97.9 F (36.6 C)  SpO2: 96% 100%     Disposition: Discharge disposition: 01-Home or Self Care       Patient Instructions:  Allergies as of 06/27/2021       Reactions   Latex Rash        Medication List     STOP taking these medications    oxyCODONE-acetaminophen 5-325 MG tablet Commonly known as: Percocet       TAKE these medications    allopurinol 100 MG tablet Commonly known as: ZYLOPRIM Take 100 mg by mouth daily.   amLODipine 10 MG tablet Commonly known as: NORVASC Take 10 mg by mouth daily.   calcium acetate 667 MG capsule Commonly known as: PHOSLO Take 2,001 mg by mouth 3 (three) times daily  with meals.   folic acid-vitamin b complex-vitamin c-selenium-zinc 3 MG Tabs tablet Take 1 tablet by mouth daily.   gabapentin 100 MG capsule Commonly known as: NEURONTIN Take 100 mg by mouth 3 (three) times daily.   hydrALAZINE 25 MG tablet Commonly known as: APRESOLINE Take 25 mg by mouth 3 (three) times daily.   HYDROcodone-acetaminophen 5-325 MG tablet Commonly known as: NORCO/VICODIN Take 1 tablet by mouth every 6 (six) hours as needed for moderate pain.   insulin NPH-regular Human (70-30) 100 UNIT/ML injection Inject 18-23 Units into the skin See admin instructions. Inject subcutaneously 23 units in the morning and 18 units at night.   loperamide 2 MG capsule Commonly known as: IMODIUM Take 2 mg by mouth 3 (three) times daily with meals as needed for diarrhea or loose stools.   metoprolol tartrate 50 MG tablet Commonly known as: LOPRESSOR Take 50 mg by mouth 2 (two) times daily.   omeprazole 20 MG capsule Commonly known as: PRILOSEC Take 20 mg by mouth 2 (two) times daily before a meal.       Activity: activity as tolerated Diet: regular diet Wound Care: keep wound clean and dry  Follow-up with [facility] in 6 weeks.  Signed: Dagoberto Ligas, PA-C 06/27/2021 4:14 PM [facility] Office: [phone]

## 2021-06-27 NOTE — Anesthesia Preprocedure Evaluation (Signed)
Anesthesia Evaluation  Patient identified by MRN, date of birth, ID band Patient awake    Reviewed: Allergy & Precautions, NPO status , Patient's Chart, lab work & pertinent test results  History of Anesthesia Complications Negative for: history of anesthetic complications  Airway Mallampati: II  TM Distance: >3 FB Neck ROM: Full    Dental  (+) Edentulous Upper, Missing, Dental Advisory Given, Poor Dentition,    Pulmonary asthma , former smoker,    breath sounds clear to auscultation       Cardiovascular hypertension, Pt. on medications and Pt. on home beta blockers (-) angina(-) Past MI and (-) CHF  Rhythm:Regular     Neuro/Psych negative neurological ROS  negative psych ROS   GI/Hepatic PUD, (+) Hepatitis -, C  Endo/Other  diabetes, Insulin DependentLab Results      Component                Value               Date                      HGBA1C                   7.2 (H)             09/08/2011             Renal/GU ESRF and DialysisRenal diseaseLab Results      Component                Value               Date                      CREATININE               8.50 (H)            06/27/2021           Lab Results      Component                Value               Date                      K                        4.6                 06/27/2021                Musculoskeletal  (+) Arthritis ,   Abdominal   Peds  Hematology  (+) Blood dyscrasia, anemia , Lab Results      Component                Value               Date                      WBC                      4.4                 09/11/2011                HGB  11.6 (L)            06/27/2021                HCT                      34.0 (L)            06/27/2021                MCV                      86.0                09/11/2011                PLT                      84 (L)              09/11/2011              Anesthesia Other Findings    Reproductive/Obstetrics                            Anesthesia Physical Anesthesia Plan  ASA: 3  Anesthesia Plan: MAC   Post-op Pain Management: Minimal or no pain anticipated   Induction: Intravenous  PONV Risk Score and Plan: 1 and Propofol infusion and Treatment may vary due to age or medical condition  Airway Management Planned: Nasal Cannula  Additional Equipment: None  Intra-op Plan:   Post-operative Plan:   Informed Consent: I have reviewed the patients History and Physical, chart, labs and discussed the procedure including the risks, benefits and alternatives for the proposed anesthesia with the patient or authorized representative who has indicated his/her understanding and acceptance.     Dental advisory given  Plan Discussed with: CRNA  Anesthesia Plan Comments:         Anesthesia Quick Evaluation

## 2021-06-27 NOTE — Transfer of Care (Signed)
Immediate Anesthesia Transfer of Care Note  Patient: Timothy Lane  Procedure(s) Performed: BRANCH LIGATION OF RIGHT BRACHIOCEPHALIC FISTULA (Right: Arm Upper)  Patient Location: PACU  Anesthesia Type:MAC  Level of Consciousness: awake, patient cooperative and responds to stimulation  Airway & Oxygen Therapy: Patient Spontanous Breathing and Patient connected to nasal cannula oxygen  Post-op Assessment: Report given to RN and Post -op Vital signs reviewed and stable  Post vital signs: Reviewed and stable  Last Vitals:  Vitals Value Taken Time  BP    Temp    Pulse    Resp 12 06/27/21 0947  SpO2    Vitals shown include unvalidated device data.  Last Pain:  Vitals:   06/27/21 0732  TempSrc:   PainSc: 4       Patients Stated Pain Goal: 2 (23/34/35 6861)  Complications: No notable events documented.

## 2021-06-27 NOTE — TOC Transition Note (Signed)
Transition of Care [facility]) - CM/SW Discharge Note   Patient Details  Name: Timothy Lane MRN: [id number] Date of Birth: 10/06/1950  Transition of Care [facility]) CM/SW Contact:  Tom-Johnson, Renea Ee, RN Phone Number: 06/27/2021, 3:57 PM   Clinical Narrative:     Patient is scheduled for discharge today. Admitted for slowly maturing right Brachiocephalic Arteriovenous Fistula. Had Arteriovenous Fistula Revision with sidebranch ligation done today. No TOC needs or recommendations noted. No further TOC needs noted.  Final next level of care: Home/Self Care Barriers to Discharge: Barriers Resolved   Patient Goals and CMS Choice Patient states their goals for this hospitalization and ongoing recovery are:: To return home CMS Medicare.gov Compare Post Acute Care list provided to:: Patient Choice offered to / list presented to : NA  Discharge Placement                Patient to be transferred to facility by: Friend      Discharge Plan and Services                DME Arranged: N/A DME Agency: NA       HH Arranged: NA HH Agency: NA        Social Determinants of Health (SDOH) Interventions     Readmission Risk Interventions     View : No data to display.

## 2021-06-27 NOTE — Progress Notes (Signed)
Patient arrived to floor in wheelchair, alert and oriented, no c/o pain. When this nurse checked on patient, he told this nurse that he had a friend that could come stay with him tonight and he would like to be discharged home. Timothy Ligas, PA made aware.

## 2021-06-27 NOTE — Interval H&P Note (Signed)
History and Physical Interval Note:  06/27/2021 8:23 AM  [name]  has presented today for surgery, with the diagnosis of ESRD.  The various methods of treatment have been discussed with the patient and family. After consideration of risks, benefits and other options for treatment, the patient has consented to  Procedure(s) with comments: Wilson (Right) - PERIPHERAL NERVE BLOCK as a surgical intervention.  The patient's history has been reviewed, patient examined, no change in status, stable for surgery.  I have reviewed the patient's chart and labs.  Questions were answered to the patient's satisfaction.     [name]

## 2021-06-27 NOTE — Op Note (Signed)
DATE OF SERVICE: 06/27/2021  PATIENT:  Timothy Lane  71 y.o. male  PRE-OPERATIVE DIAGNOSIS:  ESRD, slowly maturing right brachiocephalic arteriovenous fistula  POST-OPERATIVE DIAGNOSIS:  Same  PROCEDURE:   1) side branch ligation x 3 of right brachiocephalic arteriovenous fistula  SURGEON:  Surgeon(s) and Role:    * [name], MD - Primary  ASSISTANT: Arlee Muslim, PA-C  An experienced assistant was required given the complexity of this procedure and the standard of surgical care. My assistant helped with exposure through counter tension, suctioning, ligation and retraction to better visualize the surgical field.  My assistant expedited sewing during the case by following my sutures. Wherever I use the term "we" in the report, my assistant actively helped me with that portion of the procedure.  ANESTHESIA:   local and IV sedation  EBL: minimal  BLOOD ADMINISTERED:none  DRAINS: none   LOCAL MEDICATIONS USED:  LIDOCAINE   SPECIMEN:  none  COUNTS: confirmed correct.  TOURNIQUET:  none  PATIENT DISPOSITION:  PACU - hemodynamically stable.   Delay start of Pharmacological VTE agent (>24hrs) due to surgical blood loss or risk of bleeding: no  INDICATION FOR PROCEDURE: [name] is a 71 y.o. male with end-stage renal disease.  I created a right brachiocephalic arteriovenous fistula for him on 05/02/2021.  This has been slow to mature.  Recent duplex ultrasound showed multiple side branches stealing flow from the fistula.. After careful discussion of risks, benefits, and alternatives the patient was offered arteriovenous fistula revision with sidebranch ligation. The patient understood and wished to proceed.  OPERATIVE FINDINGS: 3 moderate size sidebranches identified on duplex.  Small transverse incisions made over the fistula.  All branches ligated and divided.  Improved thrill in the fistula after sidebranch ligation.  DESCRIPTION OF PROCEDURE: After identification of  the patient in the pre-operative holding area, the patient was transferred to the operating room. The patient was positioned supine on the operating room table. Anesthesia was induced. The right arm was prepped and draped in standard fashion. A surgical pause was performed confirming correct patient, procedure, and operative location.  Intraoperative ultrasound was used to map the course of the right brachiocephalic arteriovenous fistula.  3 sidebranches were identified in the fistula.  These were marked on the skin.  Small transverse incisions were made over the origins of the sidebranches.  Incision was carried out subtenons tissue until the fistula was encountered.  The side branch was encircled with 2-0 silk suture and divided between silk ties.  This process was repeated for the 2 additional sidebranches.  At completion, the fistula had an improved thrill.  The wounds were closed in layers using 3-0 Vicryl and 4-0 Monocryl.  Dermabond was applied  Upon completion of the case instrument and sharps counts were confirmed correct. The patient was transferred to the PACU in good condition. I was present for all portions of the procedure.  [name]. Stanford Breed, MD [facility] Phone Number: [phone] 06/27/2021 9:45 AM

## 2021-06-27 NOTE — Progress Notes (Addendum)
Contacted by Vascular PA regarding pt's need to stay overnight and pt due HD tomorrow at [place] [facility] (6:30). PA requested that [facility] call [facility] to see if [facility] has a later appt time for pt tomorrow. [place] clinic and spoke to [place]. Clinic does not have a later appt tomorrow but can treat pt on Saturday if that is appropriate. Pt would need to arrive at 11:45 for 12:00 chair time on Saturday. Above information provided to Vascular PA as possible option. Will need to call [facility] back to confirm if pt will be at clinic on Saturday for treatment. Will assist as needed.   Melven Sartorius Renal Navigator [phone]  Addendum at 11:33 am: Communicated with vascular PA who states that at this time will plan for pt to receive out-pt HD on Saturday at [facility] as long as pt's labs are stable in the am. [facility] to make clinic aware that pt will need appt on Saturday. Documented appt on AVS as well.   Addendum at 4:16 pm: Pt will d/c to home today instead of tomorrow. Met with pt at bedside and pt informs navigator that he plans to go to normal HD appt tomorrow morning. Contacted [facility] and spoke to [place]. Clinic advised pt will d/c today and plans to come for regular appt tomorrow morning. Clinic advised pt will not need appt for Saturday. Will fax d/c summary if/when completed for continuation of care.   Addendum at 4:22 pm: D/C summary faxed to [facility] unit.

## 2021-06-27 NOTE — Progress Notes (Signed)
D/C instructions reviewed with patient, verbalized understanding. IV removed, patient notified ride. To be transported to private vehicle via wheelchair.

## 2021-06-28 ENCOUNTER — Encounter (HOSPITAL_COMMUNITY): Payer: Self-pay | Admitting: Vascular Surgery

## 2021-06-28 NOTE — Anesthesia Postprocedure Evaluation (Signed)
Anesthesia Post Note  Patient: Timothy Lane  Procedure(s) Performed: BRANCH LIGATION OF RIGHT BRACHIOCEPHALIC FISTULA (Right: Arm Upper)     Patient location during evaluation: PACU Anesthesia Type: MAC Level of consciousness: awake and alert Pain management: pain level controlled Vital Signs Assessment: post-procedure vital signs reviewed and stable Respiratory status: spontaneous breathing, nonlabored ventilation and respiratory function stable Cardiovascular status: stable and blood pressure returned to baseline Postop Assessment: no apparent nausea or vomiting Anesthetic complications: no   No notable events documented.  Last Vitals:  Vitals:   06/27/21 1254 06/27/21 1505  BP: (!) 148/64 139/76  Pulse: 62 (!) 53  Resp: 13 18  Temp: 36.6 C 36.6 C  SpO2: 96% 100%    Last Pain:  Vitals:   06/27/21 1505  TempSrc: Oral  PainSc:                  Timothy Lane

## 2021-06-28 NOTE — Anesthesia Postprocedure Evaluation (Signed)
Anesthesia Post Note  Patient: QUASHON JESUS  Procedure(s) Performed: BRANCH LIGATION OF RIGHT BRACHIOCEPHALIC FISTULA (Right: Arm Upper)     Patient location during evaluation: PACU Anesthesia Type: MAC Level of consciousness: awake and alert Pain management: pain level controlled Vital Signs Assessment: post-procedure vital signs reviewed and stable Respiratory status: spontaneous breathing, nonlabored ventilation and respiratory function stable Cardiovascular status: stable and blood pressure returned to baseline Postop Assessment: no apparent nausea or vomiting Anesthetic complications: no   No notable events documented.  Last Vitals:  Vitals:   06/27/21 1254 06/27/21 1505  BP: (!) 148/64 139/76  Pulse: 62 (!) 53  Resp: 13 18  Temp: 36.6 C 36.6 C  SpO2: 96% 100%    Last Pain:  Vitals:   06/27/21 1505  TempSrc: Oral  PainSc:                  Zuha Dejonge

## 2021-07-26 ENCOUNTER — Other Ambulatory Visit: Payer: Self-pay | Admitting: *Deleted

## 2021-07-26 DIAGNOSIS — Z992 Dependence on renal dialysis: Secondary | ICD-10-CM

## 2021-08-07 ENCOUNTER — Telehealth: Payer: Self-pay

## 2021-08-07 NOTE — Telephone Encounter (Signed)
Pt called stating that he forgot when his f/u appt was.  Reviewed pt's chart, returned pt's call, two identifiers used. Informed pt of appt date and times. Pt confirmed understanding.

## 2021-08-13 ENCOUNTER — Ambulatory Visit (INDEPENDENT_AMBULATORY_CARE_PROVIDER_SITE_OTHER): Payer: No Typology Code available for payment source | Admitting: Physician Assistant

## 2021-08-13 ENCOUNTER — Ambulatory Visit (HOSPITAL_COMMUNITY)
Admission: RE | Admit: 2021-08-13 | Discharge: 2021-08-13 | Disposition: A | Discharge status: Home / Self Care | Payer: No Typology Code available for payment source | Source: Ambulatory Visit | Attending: Vascular Surgery | Admitting: Vascular Surgery

## 2021-08-13 VITALS — BP 118/69 | HR 53 | Temp 97.7°F | Resp 20 | Ht 73.0 in | Wt 211.7 lb

## 2021-08-13 DIAGNOSIS — Z992 Dependence on renal dialysis: Secondary | ICD-10-CM

## 2021-08-13 DIAGNOSIS — N186 End stage renal disease: Secondary | ICD-10-CM | POA: Insufficient documentation

## 2021-08-13 NOTE — Progress Notes (Signed)
POST OPERATIVE OFFICE NOTE    CC:  F/u for surgery  HPI:  This is a 71 y.o. male who is s/p side branch ligation x3 of right brachiocephalic AV fistula on 2/54/2706 by Dr. Stanford Breed.  The patient first underwent surgery on 05/02/2021 for creation of the right AV fistula.   The patient has had multiple left upper extremity access procedures to date.  He did have a functioning left upper extremity graft for 3 years, however it was thrombosed by 04/23/2021.  [facility] attempted thrombectomy however it was not successful.  Pt returns today for follow up.  Pt states he has not had any issues with the right arm since the surgery.  He is not in any pain.  He has been using his right arm.  He denies any steal symptoms such as hand numbness, hand pain, or grip strength issues.  He is hopeful to start dialysis with his fistula.   He currently dialyzes on MWF via right TDC.   Allergies  Allergen Reactions   Latex Rash    Current Outpatient Medications  Medication Sig Dispense Refill   allopurinol (ZYLOPRIM) 100 MG tablet Take 100 mg by mouth daily.     amLODipine (NORVASC) 10 MG tablet Take 10 mg by mouth daily.     calcium acetate (PHOSLO) 667 MG capsule Take 2,001 mg by mouth 3 (three) times daily with meals.     folic acid-vitamin b complex-vitamin c-selenium-zinc (DIALYVITE) 3 MG TABS tablet Take 1 tablet by mouth daily.     gabapentin (NEURONTIN) 100 MG capsule Take 100 mg by mouth 3 (three) times daily.     hydrALAZINE (APRESOLINE) 25 MG tablet Take 25 mg by mouth 3 (three) times daily.     HYDROcodone-acetaminophen (NORCO/VICODIN) 5-325 MG tablet Take 1 tablet by mouth every 6 (six) hours as needed for moderate pain. 10 tablet 0   insulin NPH-insulin regular (NOVOLIN 70/30) (70-30) 100 UNIT/ML injection Inject 18-23 Units into the skin See admin instructions. Inject subcutaneously 23 units in the morning and 18 units at night.     loperamide (IMODIUM) 2 MG capsule Take 2 mg by mouth 3  (three) times daily with meals as needed for diarrhea or loose stools.     metoprolol tartrate (LOPRESSOR) 50 MG tablet Take 50 mg by mouth 2 (two) times daily.     omeprazole (PRILOSEC) 20 MG capsule Take 20 mg by mouth 2 (two) times daily before a meal.     No current facility-administered medications for this visit.     ROS:  See HPI  Physical Exam:  Incision: RUE incisions well-healed. Extremities: Palpable 2+ radial pulse in the right arm.  Bilateral grip strength equal.  Good strength and motor.  Right upper extremity fistula not easily visible. Neuro: A&O x3   Fistula Duplex (08/13/2021)  Findings:  +--------------------+----------+-----------------+--------+  AVF                 PSV (cm/s)Flow Vol (mL/min)Comments  +--------------------+----------+-----------------+--------+  Native artery inflow   173           847                 +--------------------+----------+-----------------+--------+  AVF Anastomosis        696                               +--------------------+----------+-----------------+--------+      +------------+----------+-------------+-----------+------------------------  ----+  OUTFLOW VEINPSV (  cm/s)Diameter (cm)Depth (cm)           Describe              +------------+----------+-------------+-----------+------------------------  ----+  Shoulder       123        0.56        0.55                                    +------------+----------+-------------+-----------+------------------------  ----+  Prox UA        213        0.52        0.64                                    +------------+----------+-------------+-----------+------------------------  ----+  Mid UA         148        0.48        0.39                                    +------------+----------+-------------+-----------+------------------------  ----+  Dist UA        134        0.55        0.37                                     +------------+----------+-------------+-----------+------------------------  ----+  AC Fossa    177 / 770  0.81 / 0.29 0.29 / 0.39 change in Diameter  1.87 cm                                                          in length              +------------+----------+-------------+-----------+------------------------  ----+   Assessment/Plan:  This is a 71 y.o. male who is s/p: Sidebranch ligation x3 of right brachiocephalic AV fistula on 2/94/7654 by Dr. Stanford Breed   -Fistula flow volume is 847 mL/min, which is ideal -Most of the fistula is 45mm or less from the surface of the skin, which is ideal -The diameter of the fistula is not at the ideal size yet, most segments measuring less than 6 mm in diameter.  -Since the patient has undergone multiple access procedures on the left arm, we would like to be more cautious with the use of this right upper arm fistula.  It has not matured to the ideal size yet, even though the patient is 6 weeks out from his surgery. -I have encouraged the patient to continue to exercise his R arm to hopefully increase the size of the fistula.  -We will bring the patient back in 4 weeks with fistula duplex for maturation evaluation   Vicente Serene, PA-C [facility] [phone]   Clinic MD:  Roxanne Mins

## 2021-08-28 ENCOUNTER — Other Ambulatory Visit: Payer: Self-pay | Admitting: *Deleted

## 2021-08-28 DIAGNOSIS — N186 End stage renal disease: Secondary | ICD-10-CM

## 2021-09-17 NOTE — Progress Notes (Unsigned)
POST OPERATIVE OFFICE NOTE    CC:  F/u for surgery  HPI:  This is a 71 y.o. male who is s/p Sidebranch ligation x3 of right brachiocephalic AV fistula on 2/58/5277 by Dr. Stanford Breed.  On his last visit the diameter of the fistula was < 0.6 cm.  He was instructed to exercise the right UE in hopes to increase the diameter with increased arterial blood flow.  He is here today for repeat AV fistula duplex to determine the maturity of the fistula.    Pt returns today for follow up.  Pt states he has no symptoms of steal.  No hand pain , loss of motor or loss of sensation.  He has a working right IJ [facility] placed by Dr. Stanford Breed on 05/02/21.    He has HD at [facility].     Allergies  Allergen Reactions   Latex Rash    Current Outpatient Medications  Medication Sig Dispense Refill   allopurinol (ZYLOPRIM) 100 MG tablet Take 100 mg by mouth daily.     amLODipine (NORVASC) 10 MG tablet Take 10 mg by mouth daily.     calcium acetate (PHOSLO) 667 MG capsule Take 2,001 mg by mouth 3 (three) times daily with meals.     folic acid-vitamin b complex-vitamin c-selenium-zinc (DIALYVITE) 3 MG TABS tablet Take 1 tablet by mouth daily.     gabapentin (NEURONTIN) 100 MG capsule Take 100 mg by mouth 3 (three) times daily.     hydrALAZINE (APRESOLINE) 25 MG tablet Take 25 mg by mouth 3 (three) times daily.     HYDROcodone-acetaminophen (NORCO/VICODIN) 5-325 MG tablet Take 1 tablet by mouth every 6 (six) hours as needed for moderate pain. 10 tablet 0   insulin NPH-insulin regular (NOVOLIN 70/30) (70-30) 100 UNIT/ML injection Inject 18-23 Units into the skin See admin instructions. Inject subcutaneously 23 units in the morning and 18 units at night.     loperamide (IMODIUM) 2 MG capsule Take 2 mg by mouth 3 (three) times daily with meals as needed for diarrhea or loose stools.     metoprolol tartrate (LOPRESSOR) 50 MG tablet Take 50 mg by mouth 2 (two) times daily.     omeprazole (PRILOSEC) 20 MG capsule Take 20  mg by mouth 2 (two) times daily before a meal.     No current facility-administered medications for this visit.     ROS:  See HPI  Physical Exam:    Incision:  well healed  Extremities:  right UE with excellent palpable thrill in the right CB fistula.  Palpable radial pulse, grip 5/5 equal.    Findings:  +--------------------+----------+-----------------+--------+  AVF                 PSV (cm/s)Flow Vol (mL/min)Comments  +--------------------+----------+-----------------+--------+  Native artery inflow   144          1020                 +--------------------+----------+-----------------+--------+  AVF Anastomosis        350                               +--------------------+----------+-----------------+--------+      +------------+----------+------------+----------+--------------------------  ----+  OUTFLOW VEINPSV (cm/s)  Diameter  Depth (cm)           Describe                                        (  cm)                                                +------------+----------+------------+----------+--------------------------  ----+  Prox UA        185        0.59       0.58                                     +------------+----------+------------+----------+--------------------------  ----+  Mid UA         137        0.65       0.37                                     +------------+----------+------------+----------+--------------------------  ----+  Dist UA        154        0.92       0.24                                     +------------+----------+------------+----------+--------------------------  ----+  AC Fossa       495        0.36       0.36    diameter increased from  0.29                                                             cm                  +------------+----------+------------+----------+--------------------------  ----+      Summary:  Patent right BCAVF.  No evidence of stenosis or  thrombus.  No branching seen.       Assessment/Plan:  This is a 71 y.o. male who is s/p:right BC AV fistula with diameter > 0.6 cm and depth < 0.6 cm.  Plan will be to use the fistula 09/25/21 and once it is working well the [employer] can be removed.  He will f/u PRN.      Roxy Horseman  [facility] [phone]   MD on call Carlis Abbott

## 2021-09-19 ENCOUNTER — Ambulatory Visit (HOSPITAL_COMMUNITY)
Admission: RE | Admit: 2021-09-19 | Discharge: 2021-09-19 | Disposition: A | Discharge status: Home / Self Care | Payer: No Typology Code available for payment source | Source: Ambulatory Visit | Attending: Vascular Surgery | Admitting: Vascular Surgery

## 2021-09-19 ENCOUNTER — Ambulatory Visit (INDEPENDENT_AMBULATORY_CARE_PROVIDER_SITE_OTHER): Payer: No Typology Code available for payment source | Admitting: Physician Assistant

## 2021-09-19 VITALS — BP 154/89 | HR 59 | Temp 97.8°F | Resp 20 | Ht 73.0 in | Wt 208.5 lb

## 2021-09-19 DIAGNOSIS — N186 End stage renal disease: Secondary | ICD-10-CM

## 2021-09-19 DIAGNOSIS — Z992 Dependence on renal dialysis: Secondary | ICD-10-CM | POA: Diagnosis not present

## 2022-04-11 ENCOUNTER — Ambulatory Visit
Admission: RE | Admit: 2022-04-11 | Discharge: 2022-04-11 | Disposition: A | Discharge status: Home / Self Care | Payer: Self-pay | Source: Ambulatory Visit | Attending: Radiation Oncology | Admitting: Radiation Oncology

## 2022-04-11 ENCOUNTER — Other Ambulatory Visit: Payer: Self-pay | Admitting: Radiation Oncology

## 2022-04-11 DIAGNOSIS — C61 Malignant neoplasm of prostate: Secondary | ICD-10-CM

## 2022-04-14 ENCOUNTER — Other Ambulatory Visit: Payer: Self-pay | Admitting: Radiation Oncology

## 2022-04-14 ENCOUNTER — Ambulatory Visit
Admission: RE | Admit: 2022-04-14 | Discharge: 2022-04-14 | Disposition: A | Discharge status: Home / Self Care | Payer: Self-pay | Source: Ambulatory Visit | Attending: Radiation Oncology | Admitting: Radiation Oncology

## 2022-04-14 DIAGNOSIS — C61 Malignant neoplasm of prostate: Secondary | ICD-10-CM

## 2022-04-16 ENCOUNTER — Other Ambulatory Visit: Payer: Self-pay | Admitting: Radiation Oncology

## 2022-04-16 ENCOUNTER — Ambulatory Visit
Admission: RE | Admit: 2022-04-16 | Discharge: 2022-04-16 | Disposition: A | Discharge status: Home / Self Care | Payer: Self-pay | Source: Ambulatory Visit | Attending: Radiation Oncology | Admitting: Radiation Oncology

## 2022-04-16 DIAGNOSIS — C61 Malignant neoplasm of prostate: Secondary | ICD-10-CM

## 2022-04-17 NOTE — Progress Notes (Signed)
GU Location of Tumor / Histology: Prostate Ca  If Prostate Cancer, Gleason Score is (3 + 3) and PSA is (10.1 on 03/07/2022)  Biopsies     Past/Anticipated interventions by urology, if any: NA  Past/Anticipated interventions by medical oncology, if any: NA  Weight changes, if any:   IPSS: SHIM:  Bowel/Bladder complaints, if any: {:18581}   Nausea/Vomiting, if any: {:18581}  Pain issues, if any:  {:18581}  SAFETY ISSUES: Prior radiation? {:18581} Pacemaker/ICD? {:18581} Possible current pregnancy? Male Is the patient on methotrexate? No  Current Complaints / other details:

## 2022-04-22 ENCOUNTER — Encounter: Payer: Self-pay | Admitting: Radiation Oncology

## 2022-04-22 ENCOUNTER — Ambulatory Visit
Admission: RE | Admit: 2022-04-22 | Discharge: 2022-04-22 | Disposition: A | Discharge status: Home / Self Care | Payer: No Typology Code available for payment source | Source: Ambulatory Visit | Attending: Radiation Oncology | Admitting: Radiation Oncology

## 2022-04-22 VITALS — BP 116/68 | HR 73 | Temp 97.7°F | Resp 20 | Ht 73.0 in | Wt 215.2 lb

## 2022-04-22 DIAGNOSIS — C61 Malignant neoplasm of prostate: Secondary | ICD-10-CM

## 2022-04-22 NOTE — Progress Notes (Signed)
Introduced myself to the patient as the prostate nurse navigator.  No barriers to care identified at this time.  He is here to discuss his radiation treatment options.  I gave him my business card and asked him to call me with questions or concerns.  Verbalized understanding.  ?

## 2022-04-22 NOTE — Progress Notes (Signed)
Radiation Oncology         [phone] ________________________________  Initial Outpatient Consultation  Name: Timothy Lane MRN: [id number]  Date: 04/22/2022  DOB: 09/27/1950  [id number], Jeanmarie Hubert, Durene Fruits, MD   REFERRING PHYSICIAN: Alphia Kava, MD  DIAGNOSIS: 72 y.o. gentleman with Stage T1c adenocarcinoma of the prostate with Gleason score of 3+3, and PSA of 10.1.    ICD-10-CM   1. Malignant neoplasm of prostate ([place])  C61       HISTORY OF PRESENT ILLNESS: Timothy Lane is a 72 y.o. male with a diagnosis of prostate cancer. He has a history of elevated, rising PSA followed at the [facility]. He previously underwent a prostate MRI on 09/22/19 showing a 12 mm PI-RADS 4 lesion in the left peripheral zone. A PSA performed the following month was considered WNL at 3.28, but this increased to 7.7 by 05/2017. More recently, he underwent a repeat MRI on 03/03/22 showing the previous PI-RADS 4 lesion as more widespread, now with a background of scarring or chronic prostatitis. A repeat PSA on 03/07/22 showed a further rise to 10.1. Therefore, the patient proceeded to transrectal ultrasound with 15 biopsies of the prostate on 03/12/22, digital rectal examination performed at that time showed no nodules.  The prostate volume measured 47 cc.  Out of 15 core biopsies, only 1 was positive.  The maximum Gleason score was 3+3, and this was seen in 2% of a right apex core. Of note, all three of the MRI ROI targeted biopsies were benign.  The patient reviewed the biopsy results with his urologist and he has kindly been referred today for discussion of potential radiation treatment options. He is currently on dialysis 3x/week and hoping to get on the renal transplant list in the near future.   PREVIOUS RADIATION THERAPY: No  PAST MEDICAL HISTORY:  Past Medical History:  Diagnosis Date   Anemia    Arthritis    Asthma    Bronchitis    Chronic kidney disease    Mon-Wed-Fri   Diabetes mellitus    type  2   Hepatitis    hepatitis C   Hypertension    Stomach ulcer       PAST SURGICAL HISTORY: Past Surgical History:  Procedure Laterality Date   ARTERIOVENOUS GRAFT PLACEMENT     AV FISTULA PLACEMENT     AV FISTULA PLACEMENT Right 05/02/2021   Procedure: RIGHT ARM BRACHIOCEPHALIC ARTERIOVENOUS FISTULA CREATION;  Surgeon: [name], MD;  Location: [place];  Service: Vascular;  Laterality: Right;  PERIPHERAL NERVE BLOCK   COLONOSCOPY     INSERTION OF DIALYSIS CATHETER N/A 09/13/2020   Procedure: INSERTION OF DIALYSIS CATHETER;  Surgeon: [name], MD;  Location: [place] [facility];  Service: Vascular;  Laterality: N/A;   KNEE ARTHROSCOPY     left knee 2 surgeries   LIGATION OF COMPETING BRANCHES OF ARTERIOVENOUS FISTULA Right 06/27/2021   Procedure: BRANCH LIGATION OF RIGHT BRACHIOCEPHALIC FISTULA;  Surgeon: [name], MD;  Location: [place];  Service: Vascular;  Laterality: Right;  PERIPHERAL NERVE BLOCK   REVISON OF ARTERIOVENOUS FISTULA Left 09/13/2020   Procedure: REVISON OF LEFT UPPER EXTREMITY ARTERIOVENOUS FISTULA;  Surgeon: [name], MD;  Location: [place];  Service: Vascular;  Laterality: Left;   TOTAL KNEE ARTHROPLASTY  09/08/2011   Procedure: TOTAL KNEE ARTHROPLASTY;  Surgeon: Rudean Haskell, MD;  Location: [place];  Service: Orthopedics;  Laterality: Left;  left total knee arthroplasty   UPPER GI  ENDOSCOPY      FAMILY HISTORY: History reviewed. No pertinent family history.  SOCIAL HISTORY:  Social History   Socioeconomic History   Marital status: Married    Spouse name: Not on file   Number of children: Not on file   Years of education: Not on file   Highest education level: Not on file  Occupational History   Not on file  Tobacco Use   Smoking status: Former    Years: 59    Types: Cigarettes    Quit date: 2002    Years since quitting: 22.2    Passive exposure: Never   Smokeless tobacco: Never  Vaping Use   Vaping Use: Never used  Substance and Sexual  Activity   Alcohol use: No   Drug use: Not Currently    Types: Cocaine, Marijuana, "Crack" cocaine, Heroin, LSD, Methamphetamines    Comment: Last use in 2002   Sexual activity: Not Currently  Other Topics Concern   Not on file  Social History Narrative   Not on file   Social Determinants of Health   Financial Resource Strain: Not on file  Food Insecurity: No Food Insecurity (04/22/2022)   Hunger Vital Sign    Worried About Running Out of Food in the Last Year: Never true    Ran Out of Food in the Last Year: Never true  Transportation Needs: No Transportation Needs (04/22/2022)   PRAPARE - Hydrologist (Medical): No    Lack of Transportation (Non-Medical): No  Physical Activity: Not on file  Stress: Not on file  Social Connections: Not on file  Intimate Partner Violence: Not At Risk (04/22/2022)   Humiliation, Afraid, Rape, and Kick questionnaire    Fear of Current or Ex-Partner: No    Emotionally Abused: No    Physically Abused: No    Sexually Abused: No    ALLERGIES: Latex  MEDICATIONS:  Current Outpatient Medications  Medication Sig Dispense Refill   allopurinol (ZYLOPRIM) 100 MG tablet Take 100 mg by mouth daily.     Amino Acids (LIQUACEL PO) TAKE 1 OUNCE BY MOUTH DAILY FOR NUTRITION (APPROVED)     amLODipine (NORVASC) 10 MG tablet Take 10 mg by mouth daily.     B Complex-C-Folic Acid (DIALYVITE TABLET) TABS Take 1 tablet by mouth daily.     calcium acetate (PHOSLO) 667 MG capsule Take 2,001 mg by mouth 3 (three) times daily with meals.     cinacalcet (SENSIPAR) 30 MG tablet Take 1 tablet by mouth every other day.     cyclobenzaprine (FLEXERIL) 10 MG tablet TAKE ONE TABLET BY MOUTH THREE TIMES A DAY AS NEEDED FOR MUSCLE SPASMS     diclofenac Sodium (VOLTAREN) 1 % GEL APPLY 4 GRAMS TO AFFECTED AREA THREE TIMES A DAY FOR LOW BACK/JOINT PAIN     diphenoxylate-atropine (LOMOTIL) 2.5-0.025 MG tablet TAKE 1 TABLET BY MOUTH TWICE A DAY AS NEEDED  DIARRHEA     folic acid-vitamin b complex-vitamin c-selenium-zinc (DIALYVITE) 3 MG TABS tablet Take 1 tablet by mouth daily.     gabapentin (NEURONTIN) 100 MG capsule Take 100 mg by mouth 3 (three) times daily.     hydrALAZINE (APRESOLINE) 50 MG tablet Take 1 tablet by mouth 2 (two) times daily.     HYDROcodone-acetaminophen (NORCO/VICODIN) 5-325 MG tablet Take 1 tablet by mouth every 6 (six) hours as needed for moderate pain. 10 tablet 0   hydrocortisone (ANUSOL-HC) 2.5 % rectal cream INSERT SMALL AMOUNT RECTALLY TWICE  A DAY     insulin NPH-insulin regular (NOVOLIN 70/30) (70-30) 100 UNIT/ML injection Inject 18-23 Units into the skin See admin instructions. Inject subcutaneously 23 units in the morning and 18 units at night.     lanthanum (FOSRENOL) 1000 MG chewable tablet CHEW TWO TABLETS BY MOUTH THREE TIMES A DAY BEFORE MEALS     lidocaine (LIDODERM) 5 % APPLY 1 PATCH TO SKIN ONCE DAILY (APPLY FOR 12 HOURS, THEN REMOVE FOR 12 HOURS)     loperamide (IMODIUM) 2 MG capsule Take 2 mg by mouth 3 (three) times daily with meals as needed for diarrhea or loose stools.     melatonin 3 MG TABS tablet TAKE 1 TABLET/CAPSULE (3MG ) BY MOUTH AT BEDTIME AS NEEDED FOR SLEEP     methylPREDNISolone (MEDROL DOSEPAK) 4 MG TBPK tablet TAKE TABLETS BY MOUTH AS DIRECTED FOR INFLAMMATION - LOWER BACK PAIN ON PACKAGE INSTRUCTIONS START THURSDAY 03/20/22     metoprolol tartrate (LOPRESSOR) 50 MG tablet Take 50 mg by mouth 2 (two) times daily.     naloxone (NARCAN) nasal spray 4 mg/0.1 mL SPRAY 1 SPRAY INTO ONE NOSTRIL AS DIRECTED AS NEEDED FOR RESCUE FOR OPIOID OVERDOSE - CALL 911 IMMEDIATELY, ADMINISTER DOSE, THEN TURN PERSON ON SIDE - IF NO RESPONSE IN 2-3 MINUTES OR PERSON RESPONDS BUT RELAPSES, REPEAT USING A NEW SPRAY DEVICE AND SPRAY INTO THE OTHER NOSTRIL     omeprazole (PRILOSEC) 20 MG capsule Take 20 mg by mouth 2 (two) times daily before a meal.     Pancrelipase, Lip-Prot-Amyl, 20000-63000 units CPEP TAKE 1 CAPSULE  BY MOUTH THREE TIMES A DAY BEFORE MEALS     Polyethylene Glycol 1450 POWD TAKE 17 GRAMS BY MOUTH DAILY AS NEEDED (MIX WITH WATER OR OTHER LIQUID AS INSTRUCTED BEFORE DRINKING)     No current facility-administered medications for this encounter.    REVIEW OF SYSTEMS:  On review of systems, the patient reports that he is doing well overall. He denies any chest pain, shortness of breath, cough, fevers, chills, night sweats, unintended weight changes. He denies any bowel disturbances, and denies abdominal pain, nausea or vomiting. He reports chronic low back pain, rated 4/10. His IPSS was 27, indicating severe urinary symptoms although he makes only a very small volume of urine daily, on dialysis. His SHIM was 14, indicating he has moderate erectile dysfunction. A complete review of systems is obtained and is otherwise negative.    PHYSICAL EXAM:  Wt Readings from Last 3 Encounters:  04/22/22 215 lb 3.2 oz (97.6 kg)  09/19/21 208 lb 8 oz (94.6 kg)  08/13/21 211 lb 11.2 oz (96 kg)   Temp Readings from Last 3 Encounters:  04/22/22 97.7 F (36.5 C)  09/19/21 97.8 F (36.6 C)  08/13/21 97.7 F (36.5 C) (Temporal)   BP Readings from Last 3 Encounters:  04/22/22 116/68  09/19/21 (!) 154/89  08/13/21 118/69   Pulse Readings from Last 3 Encounters:  04/22/22 73  09/19/21 (!) 59  08/13/21 (!) 53   Pain Assessment Pain Score: 4  Pain Loc: Back/10  In general this is a well appearing African American man in no acute distress. He's alert and oriented x4 and appropriate throughout the examination. Cardiopulmonary assessment is negative for acute distress, and he exhibits normal effort.     KPS = 100  100 - Normal; no complaints; no evidence of disease. 90   - Able to carry on normal activity; minor signs or symptoms of disease. 80   -  Normal activity with effort; some signs or symptoms of disease. 25   - Cares for self; unable to carry on normal activity or to do active work. 60   -  Requires occasional assistance, but is able to care for most of his personal needs. 50   - Requires considerable assistance and frequent medical care. 29   - Disabled; requires special care and assistance. 26   - Severely disabled; hospital admission is indicated although death not imminent. 15   - Very sick; hospital admission necessary; active supportive treatment necessary. 10   - Moribund; fatal processes progressing rapidly. 0     - Dead  Karnofsky DA, Abelmann [place], Craver LS and Burchenal [facility] [phone]) The use of the nitrogen mustards in the palliative treatment of carcinoma: with particular reference to bronchogenic carcinoma Cancer 1 634-56  LABORATORY DATA:  Lab Results  Component Value Date   WBC 4.4 09/11/2011   HGB 11.6 (L) 06/27/2021   HCT 34.0 (L) 06/27/2021   MCV 86.0 09/11/2011   PLT 84 (L) 09/11/2011   Lab Results  Component Value Date   NA 135 06/27/2021   K 4.6 06/27/2021   CL 96 (L) 06/27/2021   CO2 30 09/11/2011   Lab Results  Component Value Date   ALT 76 (H) 09/04/2011   AST 74 (H) 09/04/2011   ALKPHOS 91 09/04/2011   BILITOT 0.5 09/04/2011     RADIOGRAPHY: No results found.    IMPRESSION/PLAN: 1. 72 y.o. gentleman with Stage T1c adenocarcinoma of the prostate with Gleason Score of 3+3, and PSA of 10.1. We discussed the patient's workup and outlined the nature of prostate cancer in this setting. The patient's T stage, Gleason's score, and PSA put him into the favorable, very low risk group. Accordingly, he is eligible for a variety of potential treatment options including active surveillance, brachytherapy, 5.5 weeks of external radiation, or prostatectomy. He is not interested in active surveillance since he is hoping to get on the renal transplant list in the near future. We discussed the available radiation techniques, and focused on the details and logistics of delivery. He is not an ideal candidate for brachytherapy in the setting of severe LUTS.   Therefore, we discussed and outlined the risks, benefits, short and long-term effects associated with daily external beam radiotherapy and compared and contrasted these with prostatectomy. He was encouraged to ask questions that were answered to his stated satisfaction.  At the conclusion of our conversation, the patient is interested in moving forward with 5.5 weeks of external beam therapy. We will share our discussion with Dr. Domenica Fail and proceed with treatment planning accordingly. He appears to have a good understanding of his disease and our treatment recommendations which are of curative intent and is in agreement with the stated plan.  He is tentatively scheduled for CT simulation/treatment planning on Tuesday, 04/29/22 at 10am, in anticipation of beginning his daily treatments in the near future. He has freely signed written consent to proceed and a copy of this document will be placed in his medical record. We enjoyed meeting him today and look forward to continuing to participate in his care.  We personally spent 60 minutes in this encounter including chart review, reviewing radiological studies, meeting face-to-face with the patient, entering orders and completing documentation.    Nicholos Johns, PA-C    Tyler Pita, MD  [facility] Direct Dial: [phone]  Fax: [phone] [email or web address]  Skype  LinkedIn   This document serves  as a record of services personally performed by Tyler Pita, MD and Freeman Caldron, PA-C. It was created on their behalf by Wilburn Mylar, a trained medical scribe. The creation of this record is based on the scribe's personal observations and the provider's statements to them. This document has been checked and approved by the attending provider.

## 2022-04-24 NOTE — Progress Notes (Signed)
RN successfully faxed over recent progress notes to [facility] to ensure they are aware of treatment plan.

## 2022-04-28 ENCOUNTER — Telehealth: Payer: Self-pay | Admitting: *Deleted

## 2022-04-28 NOTE — Telephone Encounter (Signed)
CALLED PATIENT TO REMIND OF SIM APPT. FOR 04-29-22 - ARRIVAL TIME 9:45 AM @ [place], SPOKE WITH PATIENT AND HE IS AWARE OF THIS APPT.

## 2022-04-29 ENCOUNTER — Other Ambulatory Visit: Payer: Self-pay

## 2022-04-29 ENCOUNTER — Ambulatory Visit
Admission: RE | Admit: 2022-04-29 | Discharge: 2022-04-29 | Disposition: A | Discharge status: Home / Self Care | Payer: No Typology Code available for payment source | Source: Ambulatory Visit | Attending: Radiation Oncology | Admitting: Radiation Oncology

## 2022-04-29 DIAGNOSIS — C61 Malignant neoplasm of prostate: Secondary | ICD-10-CM | POA: Insufficient documentation

## 2022-04-29 NOTE — Progress Notes (Signed)
  Radiation Oncology         [phone] ________________________________  Name: Timothy Lane MRN: [id number]  Date: 04/29/2022  DOB: [birth year]  SIMULATION AND TREATMENT PLANNING NOTE    ICD-10-CM   1. Malignant neoplasm of prostate ([place])  C61       DIAGNOSIS:  72 y.o. gentleman with Stage T1c adenocarcinoma of the prostate with Gleason score of 3+3, and PSA of 10.1.   NARRATIVE:  The patient was brought to the [facility].  Identity was confirmed.  All relevant records and images related to the planned course of therapy were reviewed.  The patient freely provided informed written consent to proceed with treatment after reviewing the details related to the planned course of therapy. The consent form was witnessed and verified by the simulation staff.  Then, the patient was set-up in a stable reproducible supine position for radiation therapy.  A vacuum lock pillow device was custom fabricated to position his legs in a reproducible immobilized position.  Then, I performed a urethrogram under sterile conditions to identify the prostatic apex.  CT images were obtained.  Surface markings were placed.  The CT images were loaded into the planning software.  Then the prostate target and avoidance structures including the rectum, bladder, bowel and hips were contoured.  Treatment planning then occurred.  The radiation prescription was entered and confirmed.  A total of one complex treatment devices was fabricated. I have requested : Intensity Modulated Radiotherapy (IMRT) is medically necessary for this case for the following reason:  Rectal sparing.Marland Kitchen  PLAN:  The patient will receive 70 Gy in 28 fractions.  ________________________________  Sheral Apley Tammi Klippel, M.D.

## 2022-05-05 DIAGNOSIS — C61 Malignant neoplasm of prostate: Secondary | ICD-10-CM | POA: Insufficient documentation

## 2022-05-06 ENCOUNTER — Telehealth: Payer: Self-pay

## 2022-05-13 ENCOUNTER — Telehealth: Payer: Self-pay | Admitting: Radiation Oncology

## 2022-05-13 ENCOUNTER — Ambulatory Visit: Payer: No Typology Code available for payment source

## 2022-05-13 NOTE — Telephone Encounter (Signed)
4/9 @ 3:54 pm Patient left voicemail to let someone know that he had a Family Emergency and could not make his treatment appt today.  He would like a call back at [phone].  Called L1 machine spoke to [facility], so they are aware.

## 2022-05-14 ENCOUNTER — Other Ambulatory Visit: Payer: Self-pay

## 2022-05-14 ENCOUNTER — Ambulatory Visit
Admission: RE | Admit: 2022-05-14 | Discharge: 2022-05-14 | Disposition: A | Discharge status: Home / Self Care | Payer: No Typology Code available for payment source | Source: Ambulatory Visit | Attending: Radiation Oncology | Admitting: Radiation Oncology

## 2022-05-14 DIAGNOSIS — C61 Malignant neoplasm of prostate: Secondary | ICD-10-CM | POA: Diagnosis not present

## 2022-05-14 LAB — RAD ONC ARIA SESSION SUMMARY
Course Elapsed Days: 0
Plan Fractions Treated to Date: 1
Plan Prescribed Dose Per Fraction: 2.5 Gy
Plan Total Fractions Prescribed: 28
Plan Total Prescribed Dose: 70 Gy
Reference Point Dosage Given to Date: 2.5 Gy
Reference Point Session Dosage Given: 2.5 Gy
Session Number: 1

## 2022-05-15 ENCOUNTER — Other Ambulatory Visit: Payer: Self-pay

## 2022-05-15 ENCOUNTER — Ambulatory Visit
Admission: RE | Admit: 2022-05-15 | Discharge: 2022-05-15 | Disposition: A | Discharge status: Home / Self Care | Payer: No Typology Code available for payment source | Source: Ambulatory Visit | Attending: Radiation Oncology | Admitting: Radiation Oncology

## 2022-05-15 DIAGNOSIS — C61 Malignant neoplasm of prostate: Secondary | ICD-10-CM | POA: Diagnosis not present

## 2022-05-15 LAB — RAD ONC ARIA SESSION SUMMARY
Course Elapsed Days: 1
Plan Fractions Treated to Date: 2
Plan Prescribed Dose Per Fraction: 2.5 Gy
Plan Total Fractions Prescribed: 28
Plan Total Prescribed Dose: 70 Gy
Reference Point Dosage Given to Date: 5 Gy
Reference Point Session Dosage Given: 2.5 Gy
Session Number: 2

## 2022-05-16 ENCOUNTER — Other Ambulatory Visit: Payer: Self-pay

## 2022-05-16 ENCOUNTER — Ambulatory Visit
Admission: RE | Admit: 2022-05-16 | Discharge: 2022-05-16 | Disposition: A | Discharge status: Home / Self Care | Payer: No Typology Code available for payment source | Source: Ambulatory Visit | Attending: Radiation Oncology | Admitting: Radiation Oncology

## 2022-05-16 DIAGNOSIS — C61 Malignant neoplasm of prostate: Secondary | ICD-10-CM | POA: Diagnosis not present

## 2022-05-16 LAB — RAD ONC ARIA SESSION SUMMARY
Course Elapsed Days: 2
Plan Fractions Treated to Date: 3
Plan Prescribed Dose Per Fraction: 2.5 Gy
Plan Total Fractions Prescribed: 28
Plan Total Prescribed Dose: 70 Gy
Reference Point Dosage Given to Date: 7.5 Gy
Reference Point Session Dosage Given: 2.5 Gy
Session Number: 3

## 2022-05-19 ENCOUNTER — Ambulatory Visit
Admission: RE | Admit: 2022-05-19 | Discharge: 2022-05-19 | Disposition: A | Discharge status: Home / Self Care | Payer: No Typology Code available for payment source | Source: Ambulatory Visit | Attending: Radiation Oncology | Admitting: Radiation Oncology

## 2022-05-19 ENCOUNTER — Other Ambulatory Visit: Payer: Self-pay

## 2022-05-19 DIAGNOSIS — C61 Malignant neoplasm of prostate: Secondary | ICD-10-CM | POA: Diagnosis not present

## 2022-05-19 LAB — RAD ONC ARIA SESSION SUMMARY
Course Elapsed Days: 5
Plan Fractions Treated to Date: 4
Plan Prescribed Dose Per Fraction: 2.5 Gy
Plan Total Fractions Prescribed: 28
Plan Total Prescribed Dose: 70 Gy
Reference Point Dosage Given to Date: 10 Gy
Reference Point Session Dosage Given: 2.5 Gy
Session Number: 4

## 2022-05-20 ENCOUNTER — Other Ambulatory Visit: Payer: Self-pay

## 2022-05-20 ENCOUNTER — Ambulatory Visit
Admission: RE | Admit: 2022-05-20 | Discharge: 2022-05-20 | Disposition: A | Discharge status: Home / Self Care | Payer: No Typology Code available for payment source | Source: Ambulatory Visit | Attending: Radiation Oncology | Admitting: Radiation Oncology

## 2022-05-20 ENCOUNTER — Telehealth: Payer: Self-pay

## 2022-05-20 DIAGNOSIS — C61 Malignant neoplasm of prostate: Secondary | ICD-10-CM | POA: Diagnosis not present

## 2022-05-20 LAB — RAD ONC ARIA SESSION SUMMARY
Course Elapsed Days: 6
Plan Fractions Treated to Date: 5
Plan Prescribed Dose Per Fraction: 2.5 Gy
Plan Total Fractions Prescribed: 28
Plan Total Prescribed Dose: 70 Gy
Reference Point Dosage Given to Date: 12.5 Gy
Reference Point Session Dosage Given: 2.5 Gy
Session Number: 5

## 2022-05-21 ENCOUNTER — Other Ambulatory Visit: Payer: Self-pay

## 2022-05-21 ENCOUNTER — Ambulatory Visit
Admission: RE | Admit: 2022-05-21 | Discharge: 2022-05-21 | Disposition: A | Discharge status: Home / Self Care | Payer: No Typology Code available for payment source | Source: Ambulatory Visit | Attending: Radiation Oncology

## 2022-05-21 DIAGNOSIS — C61 Malignant neoplasm of prostate: Secondary | ICD-10-CM | POA: Diagnosis not present

## 2022-05-21 LAB — RAD ONC ARIA SESSION SUMMARY
Course Elapsed Days: 7
Plan Fractions Treated to Date: 6
Plan Prescribed Dose Per Fraction: 2.5 Gy
Plan Total Fractions Prescribed: 28
Plan Total Prescribed Dose: 70 Gy
Reference Point Dosage Given to Date: 15 Gy
Reference Point Session Dosage Given: 2.5 Gy
Session Number: 6

## 2022-05-22 ENCOUNTER — Ambulatory Visit
Admission: RE | Admit: 2022-05-22 | Discharge: 2022-05-22 | Disposition: A | Discharge status: Home / Self Care | Payer: No Typology Code available for payment source | Source: Ambulatory Visit | Attending: Radiation Oncology | Admitting: Radiation Oncology

## 2022-05-22 ENCOUNTER — Other Ambulatory Visit: Payer: Self-pay

## 2022-05-22 DIAGNOSIS — C61 Malignant neoplasm of prostate: Secondary | ICD-10-CM | POA: Diagnosis not present

## 2022-05-22 LAB — RAD ONC ARIA SESSION SUMMARY
Course Elapsed Days: 8
Plan Fractions Treated to Date: 7
Plan Prescribed Dose Per Fraction: 2.5 Gy
Plan Total Fractions Prescribed: 28
Plan Total Prescribed Dose: 70 Gy
Reference Point Dosage Given to Date: 17.5 Gy
Reference Point Session Dosage Given: 2.5 Gy
Session Number: 7

## 2022-05-23 ENCOUNTER — Ambulatory Visit: Payer: No Typology Code available for payment source

## 2022-05-26 ENCOUNTER — Other Ambulatory Visit: Payer: Self-pay

## 2022-05-26 ENCOUNTER — Ambulatory Visit
Admission: RE | Admit: 2022-05-26 | Discharge: 2022-05-26 | Disposition: A | Discharge status: Home / Self Care | Payer: No Typology Code available for payment source | Source: Ambulatory Visit | Attending: Radiation Oncology

## 2022-05-26 DIAGNOSIS — C61 Malignant neoplasm of prostate: Secondary | ICD-10-CM | POA: Diagnosis not present

## 2022-05-26 LAB — RAD ONC ARIA SESSION SUMMARY
Course Elapsed Days: 12
Plan Fractions Treated to Date: 8
Plan Prescribed Dose Per Fraction: 2.5 Gy
Plan Total Fractions Prescribed: 28
Plan Total Prescribed Dose: 70 Gy
Reference Point Dosage Given to Date: 20 Gy
Reference Point Session Dosage Given: 2.5 Gy
Session Number: 8

## 2022-05-27 ENCOUNTER — Other Ambulatory Visit: Payer: Self-pay

## 2022-05-27 ENCOUNTER — Ambulatory Visit
Admission: RE | Admit: 2022-05-27 | Discharge: 2022-05-27 | Disposition: A | Discharge status: Home / Self Care | Payer: No Typology Code available for payment source | Source: Ambulatory Visit | Attending: Radiation Oncology | Admitting: Radiation Oncology

## 2022-05-27 DIAGNOSIS — C61 Malignant neoplasm of prostate: Secondary | ICD-10-CM | POA: Diagnosis not present

## 2022-05-27 LAB — RAD ONC ARIA SESSION SUMMARY
Course Elapsed Days: 13
Plan Fractions Treated to Date: 9
Plan Prescribed Dose Per Fraction: 2.5 Gy
Plan Total Fractions Prescribed: 28
Plan Total Prescribed Dose: 70 Gy
Reference Point Dosage Given to Date: 22.5 Gy
Reference Point Session Dosage Given: 2.5 Gy
Session Number: 9

## 2022-05-28 ENCOUNTER — Ambulatory Visit
Admission: RE | Admit: 2022-05-28 | Discharge: 2022-05-28 | Disposition: A | Discharge status: Home / Self Care | Payer: No Typology Code available for payment source | Source: Ambulatory Visit | Attending: Radiation Oncology | Admitting: Radiation Oncology

## 2022-05-28 ENCOUNTER — Other Ambulatory Visit: Payer: Self-pay

## 2022-05-28 DIAGNOSIS — C61 Malignant neoplasm of prostate: Secondary | ICD-10-CM | POA: Diagnosis not present

## 2022-05-28 LAB — RAD ONC ARIA SESSION SUMMARY
Course Elapsed Days: 14
Plan Fractions Treated to Date: 10
Plan Prescribed Dose Per Fraction: 2.5 Gy
Plan Total Fractions Prescribed: 28
Plan Total Prescribed Dose: 70 Gy
Reference Point Dosage Given to Date: 25 Gy
Reference Point Session Dosage Given: 2.5 Gy
Session Number: 10

## 2022-05-29 ENCOUNTER — Other Ambulatory Visit: Payer: Self-pay

## 2022-05-29 ENCOUNTER — Ambulatory Visit
Admission: RE | Admit: 2022-05-29 | Discharge: 2022-05-29 | Disposition: A | Discharge status: Home / Self Care | Payer: No Typology Code available for payment source | Source: Ambulatory Visit | Attending: Radiation Oncology | Admitting: Radiation Oncology

## 2022-05-29 DIAGNOSIS — C61 Malignant neoplasm of prostate: Secondary | ICD-10-CM | POA: Diagnosis not present

## 2022-05-29 LAB — RAD ONC ARIA SESSION SUMMARY
Course Elapsed Days: 15
Plan Fractions Treated to Date: 11
Plan Prescribed Dose Per Fraction: 2.5 Gy
Plan Total Fractions Prescribed: 28
Plan Total Prescribed Dose: 70 Gy
Reference Point Dosage Given to Date: 27.5 Gy
Reference Point Session Dosage Given: 2.5 Gy
Session Number: 11

## 2022-05-30 ENCOUNTER — Ambulatory Visit
Admission: RE | Admit: 2022-05-30 | Discharge: 2022-05-30 | Disposition: A | Discharge status: Home / Self Care | Payer: No Typology Code available for payment source | Source: Ambulatory Visit | Attending: Radiation Oncology | Admitting: Radiation Oncology

## 2022-05-30 ENCOUNTER — Other Ambulatory Visit: Payer: Self-pay

## 2022-05-30 ENCOUNTER — Ambulatory Visit
Admission: RE | Admit: 2022-05-30 | Discharge: 2022-05-30 | Disposition: A | Discharge status: Home / Self Care | Payer: No Typology Code available for payment source | Source: Ambulatory Visit | Attending: Radiation Oncology

## 2022-05-30 DIAGNOSIS — C61 Malignant neoplasm of prostate: Secondary | ICD-10-CM | POA: Diagnosis not present

## 2022-05-30 LAB — RAD ONC ARIA SESSION SUMMARY
Course Elapsed Days: 16
Plan Fractions Treated to Date: 12
Plan Prescribed Dose Per Fraction: 2.5 Gy
Plan Total Fractions Prescribed: 28
Plan Total Prescribed Dose: 70 Gy
Reference Point Dosage Given to Date: 30 Gy
Reference Point Session Dosage Given: 2.5 Gy
Session Number: 12

## 2022-06-02 ENCOUNTER — Ambulatory Visit
Admission: RE | Admit: 2022-06-02 | Discharge: 2022-06-02 | Disposition: A | Discharge status: Home / Self Care | Payer: No Typology Code available for payment source | Source: Ambulatory Visit | Attending: Radiation Oncology | Admitting: Radiation Oncology

## 2022-06-02 ENCOUNTER — Other Ambulatory Visit: Payer: Self-pay

## 2022-06-02 DIAGNOSIS — C61 Malignant neoplasm of prostate: Secondary | ICD-10-CM | POA: Diagnosis not present

## 2022-06-02 LAB — RAD ONC ARIA SESSION SUMMARY
Course Elapsed Days: 19
Plan Fractions Treated to Date: 13
Plan Prescribed Dose Per Fraction: 2.5 Gy
Plan Total Fractions Prescribed: 28
Plan Total Prescribed Dose: 70 Gy
Reference Point Dosage Given to Date: 32.5 Gy
Reference Point Session Dosage Given: 2.5 Gy
Session Number: 13

## 2022-06-03 ENCOUNTER — Other Ambulatory Visit: Payer: Self-pay

## 2022-06-03 ENCOUNTER — Ambulatory Visit
Admission: RE | Admit: 2022-06-03 | Discharge: 2022-06-03 | Disposition: A | Discharge status: Home / Self Care | Payer: No Typology Code available for payment source | Source: Ambulatory Visit | Attending: Radiation Oncology | Admitting: Radiation Oncology

## 2022-06-03 DIAGNOSIS — C61 Malignant neoplasm of prostate: Secondary | ICD-10-CM | POA: Diagnosis not present

## 2022-06-03 LAB — RAD ONC ARIA SESSION SUMMARY
Course Elapsed Days: 20
Plan Fractions Treated to Date: 14
Plan Prescribed Dose Per Fraction: 2.5 Gy
Plan Total Fractions Prescribed: 28
Plan Total Prescribed Dose: 70 Gy
Reference Point Dosage Given to Date: 35 Gy
Reference Point Session Dosage Given: 2.5 Gy
Session Number: 14

## 2022-06-04 ENCOUNTER — Ambulatory Visit
Admission: RE | Admit: 2022-06-04 | Discharge: 2022-06-04 | Disposition: A | Discharge status: Home / Self Care | Payer: No Typology Code available for payment source | Source: Ambulatory Visit | Attending: Radiation Oncology | Admitting: Radiation Oncology

## 2022-06-04 ENCOUNTER — Other Ambulatory Visit: Payer: Self-pay

## 2022-06-04 DIAGNOSIS — C61 Malignant neoplasm of prostate: Secondary | ICD-10-CM | POA: Diagnosis present

## 2022-06-04 LAB — RAD ONC ARIA SESSION SUMMARY
Course Elapsed Days: 21
Plan Fractions Treated to Date: 15
Plan Prescribed Dose Per Fraction: 2.5 Gy
Plan Total Fractions Prescribed: 28
Plan Total Prescribed Dose: 70 Gy
Reference Point Dosage Given to Date: 37.5 Gy
Reference Point Session Dosage Given: 2.5 Gy
Session Number: 15

## 2022-06-05 ENCOUNTER — Other Ambulatory Visit: Payer: Self-pay

## 2022-06-05 ENCOUNTER — Ambulatory Visit
Admission: RE | Admit: 2022-06-05 | Discharge: 2022-06-05 | Disposition: A | Discharge status: Home / Self Care | Payer: No Typology Code available for payment source | Source: Ambulatory Visit | Attending: Radiation Oncology | Admitting: Radiation Oncology

## 2022-06-05 ENCOUNTER — Ambulatory Visit: Admission: RE | Admit: 2022-06-05 | Payer: No Typology Code available for payment source | Source: Ambulatory Visit

## 2022-06-05 DIAGNOSIS — C61 Malignant neoplasm of prostate: Secondary | ICD-10-CM | POA: Diagnosis not present

## 2022-06-05 LAB — RAD ONC ARIA SESSION SUMMARY
Course Elapsed Days: 22
Plan Fractions Treated to Date: 16
Plan Prescribed Dose Per Fraction: 2.5 Gy
Plan Total Fractions Prescribed: 28
Plan Total Prescribed Dose: 70 Gy
Reference Point Dosage Given to Date: 40 Gy
Reference Point Session Dosage Given: 2.5 Gy
Session Number: 16

## 2022-06-06 ENCOUNTER — Other Ambulatory Visit: Payer: Self-pay

## 2022-06-06 ENCOUNTER — Ambulatory Visit
Admission: RE | Admit: 2022-06-06 | Discharge: 2022-06-06 | Disposition: A | Discharge status: Home / Self Care | Payer: No Typology Code available for payment source | Source: Ambulatory Visit | Attending: Radiation Oncology

## 2022-06-06 ENCOUNTER — Ambulatory Visit: Payer: No Typology Code available for payment source

## 2022-06-06 DIAGNOSIS — C61 Malignant neoplasm of prostate: Secondary | ICD-10-CM | POA: Diagnosis not present

## 2022-06-06 LAB — RAD ONC ARIA SESSION SUMMARY
Course Elapsed Days: 23
Plan Fractions Treated to Date: 17
Plan Prescribed Dose Per Fraction: 2.5 Gy
Plan Total Fractions Prescribed: 28
Plan Total Prescribed Dose: 70 Gy
Reference Point Dosage Given to Date: 42.5 Gy
Reference Point Session Dosage Given: 2.5 Gy
Session Number: 17

## 2022-06-09 ENCOUNTER — Ambulatory Visit: Payer: No Typology Code available for payment source

## 2022-06-09 ENCOUNTER — Other Ambulatory Visit: Payer: Self-pay

## 2022-06-09 ENCOUNTER — Ambulatory Visit
Admission: RE | Admit: 2022-06-09 | Discharge: 2022-06-09 | Disposition: A | Discharge status: Home / Self Care | Payer: No Typology Code available for payment source | Source: Ambulatory Visit | Attending: Radiation Oncology | Admitting: Radiation Oncology

## 2022-06-09 DIAGNOSIS — C61 Malignant neoplasm of prostate: Secondary | ICD-10-CM | POA: Diagnosis not present

## 2022-06-09 LAB — RAD ONC ARIA SESSION SUMMARY
Course Elapsed Days: 26
Plan Fractions Treated to Date: 18
Plan Prescribed Dose Per Fraction: 2.5 Gy
Plan Total Fractions Prescribed: 28
Plan Total Prescribed Dose: 70 Gy
Reference Point Dosage Given to Date: 45 Gy
Reference Point Session Dosage Given: 2.5 Gy
Session Number: 18

## 2022-06-10 ENCOUNTER — Ambulatory Visit
Admission: RE | Admit: 2022-06-10 | Discharge: 2022-06-10 | Disposition: A | Discharge status: Home / Self Care | Payer: No Typology Code available for payment source | Source: Ambulatory Visit | Attending: Radiation Oncology | Admitting: Radiation Oncology

## 2022-06-10 ENCOUNTER — Ambulatory Visit: Payer: No Typology Code available for payment source

## 2022-06-10 ENCOUNTER — Other Ambulatory Visit: Payer: Self-pay

## 2022-06-10 DIAGNOSIS — C61 Malignant neoplasm of prostate: Secondary | ICD-10-CM | POA: Diagnosis not present

## 2022-06-10 LAB — RAD ONC ARIA SESSION SUMMARY
Course Elapsed Days: 27
Plan Fractions Treated to Date: 19
Plan Prescribed Dose Per Fraction: 2.5 Gy
Plan Total Fractions Prescribed: 28
Plan Total Prescribed Dose: 70 Gy
Reference Point Dosage Given to Date: 47.5 Gy
Reference Point Session Dosage Given: 2.5 Gy
Session Number: 19

## 2022-06-11 ENCOUNTER — Other Ambulatory Visit: Payer: Self-pay

## 2022-06-11 ENCOUNTER — Ambulatory Visit
Admission: RE | Admit: 2022-06-11 | Discharge: 2022-06-11 | Discharge status: Home / Self Care | Payer: No Typology Code available for payment source | Source: Ambulatory Visit | Attending: Radiation Oncology

## 2022-06-11 ENCOUNTER — Ambulatory Visit
Admission: RE | Admit: 2022-06-11 | Discharge: 2022-06-11 | Disposition: A | Discharge status: Home / Self Care | Payer: No Typology Code available for payment source | Source: Ambulatory Visit | Attending: Radiation Oncology | Admitting: Radiation Oncology

## 2022-06-11 DIAGNOSIS — C61 Malignant neoplasm of prostate: Secondary | ICD-10-CM | POA: Diagnosis not present

## 2022-06-11 LAB — RAD ONC ARIA SESSION SUMMARY
Course Elapsed Days: 28
Plan Fractions Treated to Date: 20
Plan Prescribed Dose Per Fraction: 2.5 Gy
Plan Total Fractions Prescribed: 28
Plan Total Prescribed Dose: 70 Gy
Reference Point Dosage Given to Date: 50 Gy
Reference Point Session Dosage Given: 2.5 Gy
Session Number: 20

## 2022-06-12 ENCOUNTER — Ambulatory Visit
Admission: RE | Admit: 2022-06-12 | Discharge: 2022-06-12 | Disposition: A | Discharge status: Home / Self Care | Payer: No Typology Code available for payment source | Source: Ambulatory Visit | Attending: Radiation Oncology | Admitting: Radiation Oncology

## 2022-06-12 ENCOUNTER — Other Ambulatory Visit: Payer: Self-pay

## 2022-06-12 DIAGNOSIS — C61 Malignant neoplasm of prostate: Secondary | ICD-10-CM | POA: Diagnosis not present

## 2022-06-12 LAB — RAD ONC ARIA SESSION SUMMARY
Course Elapsed Days: 29
Plan Fractions Treated to Date: 21
Plan Prescribed Dose Per Fraction: 2.5 Gy
Plan Total Fractions Prescribed: 28
Plan Total Prescribed Dose: 70 Gy
Reference Point Dosage Given to Date: 52.5 Gy
Reference Point Session Dosage Given: 2.5 Gy
Session Number: 21

## 2022-06-13 ENCOUNTER — Ambulatory Visit
Admission: RE | Admit: 2022-06-13 | Discharge: 2022-06-13 | Disposition: A | Discharge status: Home / Self Care | Payer: No Typology Code available for payment source | Source: Ambulatory Visit | Attending: Radiation Oncology | Admitting: Radiation Oncology

## 2022-06-13 ENCOUNTER — Other Ambulatory Visit: Payer: Self-pay

## 2022-06-13 DIAGNOSIS — C61 Malignant neoplasm of prostate: Secondary | ICD-10-CM | POA: Diagnosis not present

## 2022-06-13 LAB — RAD ONC ARIA SESSION SUMMARY
Course Elapsed Days: 30
Plan Fractions Treated to Date: 22
Plan Prescribed Dose Per Fraction: 2.5 Gy
Plan Total Fractions Prescribed: 28
Plan Total Prescribed Dose: 70 Gy
Reference Point Dosage Given to Date: 55 Gy
Reference Point Session Dosage Given: 2.5 Gy
Session Number: 22

## 2022-06-16 ENCOUNTER — Ambulatory Visit
Admission: RE | Admit: 2022-06-16 | Discharge: 2022-06-16 | Disposition: A | Discharge status: Home / Self Care | Payer: No Typology Code available for payment source | Source: Ambulatory Visit | Attending: Radiation Oncology | Admitting: Radiation Oncology

## 2022-06-16 ENCOUNTER — Other Ambulatory Visit: Payer: Self-pay

## 2022-06-16 DIAGNOSIS — C61 Malignant neoplasm of prostate: Secondary | ICD-10-CM | POA: Diagnosis not present

## 2022-06-16 LAB — RAD ONC ARIA SESSION SUMMARY
Course Elapsed Days: 33
Plan Fractions Treated to Date: 23
Plan Prescribed Dose Per Fraction: 2.5 Gy
Plan Total Fractions Prescribed: 28
Plan Total Prescribed Dose: 70 Gy
Reference Point Dosage Given to Date: 57.5 Gy
Reference Point Session Dosage Given: 2.5 Gy
Session Number: 23

## 2022-06-17 ENCOUNTER — Ambulatory Visit: Payer: No Typology Code available for payment source

## 2022-06-17 ENCOUNTER — Other Ambulatory Visit: Payer: Self-pay

## 2022-06-17 ENCOUNTER — Ambulatory Visit
Admission: RE | Admit: 2022-06-17 | Discharge: 2022-06-17 | Disposition: A | Discharge status: Home / Self Care | Payer: No Typology Code available for payment source | Source: Ambulatory Visit | Attending: Radiation Oncology | Admitting: Radiation Oncology

## 2022-06-17 DIAGNOSIS — C61 Malignant neoplasm of prostate: Secondary | ICD-10-CM | POA: Diagnosis not present

## 2022-06-17 LAB — RAD ONC ARIA SESSION SUMMARY
Course Elapsed Days: 34
Plan Fractions Treated to Date: 24
Plan Prescribed Dose Per Fraction: 2.5 Gy
Plan Total Fractions Prescribed: 28
Plan Total Prescribed Dose: 70 Gy
Reference Point Dosage Given to Date: 60 Gy
Reference Point Session Dosage Given: 2.5 Gy
Session Number: 24

## 2022-06-18 ENCOUNTER — Ambulatory Visit: Admission: RE | Admit: 2022-06-18 | Payer: No Typology Code available for payment source | Source: Ambulatory Visit

## 2022-06-18 ENCOUNTER — Ambulatory Visit
Admission: RE | Admit: 2022-06-18 | Discharge: 2022-06-18 | Disposition: A | Discharge status: Home / Self Care | Payer: No Typology Code available for payment source | Source: Ambulatory Visit | Attending: Radiation Oncology

## 2022-06-18 ENCOUNTER — Other Ambulatory Visit: Payer: Self-pay

## 2022-06-18 DIAGNOSIS — C61 Malignant neoplasm of prostate: Secondary | ICD-10-CM | POA: Diagnosis not present

## 2022-06-18 LAB — RAD ONC ARIA SESSION SUMMARY
Course Elapsed Days: 35
Plan Fractions Treated to Date: 25
Plan Prescribed Dose Per Fraction: 2.5 Gy
Plan Total Fractions Prescribed: 28
Plan Total Prescribed Dose: 70 Gy
Reference Point Dosage Given to Date: 62.5 Gy
Reference Point Session Dosage Given: 2.5 Gy
Session Number: 25

## 2022-06-19 ENCOUNTER — Ambulatory Visit: Payer: No Typology Code available for payment source

## 2022-06-19 ENCOUNTER — Other Ambulatory Visit: Payer: Self-pay

## 2022-06-19 ENCOUNTER — Ambulatory Visit
Admission: RE | Admit: 2022-06-19 | Discharge: 2022-06-19 | Disposition: A | Discharge status: Home / Self Care | Payer: No Typology Code available for payment source | Source: Ambulatory Visit | Attending: Radiation Oncology | Admitting: Radiation Oncology

## 2022-06-19 DIAGNOSIS — C61 Malignant neoplasm of prostate: Secondary | ICD-10-CM | POA: Diagnosis not present

## 2022-06-19 LAB — RAD ONC ARIA SESSION SUMMARY
Course Elapsed Days: 36
Plan Fractions Treated to Date: 26
Plan Prescribed Dose Per Fraction: 2.5 Gy
Plan Total Fractions Prescribed: 28
Plan Total Prescribed Dose: 70 Gy
Reference Point Dosage Given to Date: 65 Gy
Reference Point Session Dosage Given: 2.5 Gy
Session Number: 26

## 2022-06-20 ENCOUNTER — Ambulatory Visit: Payer: No Typology Code available for payment source

## 2022-06-20 ENCOUNTER — Ambulatory Visit
Admission: RE | Admit: 2022-06-20 | Discharge: 2022-06-20 | Disposition: A | Discharge status: Home / Self Care | Payer: No Typology Code available for payment source | Source: Ambulatory Visit | Attending: Radiation Oncology

## 2022-06-20 ENCOUNTER — Other Ambulatory Visit: Payer: Self-pay

## 2022-06-20 DIAGNOSIS — C61 Malignant neoplasm of prostate: Secondary | ICD-10-CM | POA: Diagnosis not present

## 2022-06-20 LAB — RAD ONC ARIA SESSION SUMMARY
Course Elapsed Days: 37
Plan Fractions Treated to Date: 27
Plan Prescribed Dose Per Fraction: 2.5 Gy
Plan Total Fractions Prescribed: 28
Plan Total Prescribed Dose: 70 Gy
Reference Point Dosage Given to Date: 67.5 Gy
Reference Point Session Dosage Given: 2.5 Gy
Session Number: 27

## 2022-06-23 ENCOUNTER — Ambulatory Visit
Admission: RE | Admit: 2022-06-23 | Discharge: 2022-06-23 | Disposition: A | Discharge status: Home / Self Care | Payer: No Typology Code available for payment source | Source: Ambulatory Visit | Attending: Radiation Oncology | Admitting: Radiation Oncology

## 2022-06-23 ENCOUNTER — Other Ambulatory Visit: Payer: Self-pay

## 2022-06-23 DIAGNOSIS — C61 Malignant neoplasm of prostate: Secondary | ICD-10-CM | POA: Diagnosis not present

## 2022-06-23 LAB — RAD ONC ARIA SESSION SUMMARY
Course Elapsed Days: 40
Plan Fractions Treated to Date: 28
Plan Prescribed Dose Per Fraction: 2.5 Gy
Plan Total Fractions Prescribed: 28
Plan Total Prescribed Dose: 70 Gy
Reference Point Dosage Given to Date: 70 Gy
Reference Point Session Dosage Given: 2.5 Gy
Session Number: 28

## 2022-06-26 NOTE — Radiation Completion Notes (Addendum)
  Radiation Oncology         [phone] ________________________________  Name: Timothy Lane MRN: [id number]  Date: 06/23/2022  DOB: 21-Mar-1950  End of Treatment Note.  Patient Name: Timothy Lane, Timothy Lane MRN: [id number] Date of Birth: Jun 11, 1950 Referring Physician: Regino Bellow, M.D. Date of Service: 2022-06-26 Radiation Oncologist: Margaretmary Bayley, M.D. [facility]     RADIATION ONCOLOGY END OF TREATMENT NOTE     Diagnosis: 72 y.o. gentleman with Stage T1c adenocarcinoma of the prostate with Gleason score of 3+3, and PSA of 10.1.   Intent: Curative     ==========DELIVERED PLANS==========  First Treatment Date: 2022-05-14 - Last Treatment Date: 2022-06-23   Plan Name: Prostate Site: Prostate Technique: IMRT Mode: Photon Dose Per Fraction: 2.5 Gy Prescribed Dose (Delivered / Prescribed): 70 Gy / 70 Gy Prescribed Fxs (Delivered / Prescribed): 28 / 28     ==========ON TREATMENT VISIT DATES========== 2022-05-15, 2022-05-22, 2022-05-30, 2022-06-11, 2022-06-18, 2022-06-23    See weekly On Treatment Notes is Epic for details. The patient tolerated radiation treatment relatively well with only mild increased nocturia and modest fatigue.    The patient will receive a call in about one month from the radiation oncology department. He will continue follow up with his urologist, Dr. Garald Braver at the [facility] as well.  ------------------------------------------------   Margaretmary Dys, MD [facility]  Radiation Oncology Direct Dial: [phone]  Fax: [phone] [email or web address]  Skype  LinkedIn

## 2022-06-26 NOTE — Progress Notes (Signed)
Patient was a RadOnc Consult on 04/22/22 for his stage T1c adenocarcinoma of the prostate with Gleason score of 3+3, and PSA of 10.1. Patient proceed with treatment recommendations of 5.5 weeks of external beam therapy and had his final radiation treatment on 06/23/2022.   Patient is scheduled for a post treatment nurse call on 08/05/22 and has a follow up with [facility]rology on 08/28/22 @ 11am.   RN left voicemail for call back to review post treatment education.

## 2022-07-11 NOTE — Progress Notes (Signed)
RN spoke with patient and provided post treatment education and notified of [facility] appointment.   No additional needs at this time.

## 2022-08-04 NOTE — Progress Notes (Signed)
  Radiation Oncology         [phone] ________________________________  Name: Timothy Lane MRN: [id number]  Date of Service: 08/05/2022  DOB: [birth year]  Post Treatment Telephone Note  Diagnosis:  72 y.o. gentleman with Stage T1c adenocarcinoma of the prostate with Gleason score of 3+3, and PSA of 10.1. (as documented in provider EOT note)   Pre Treatment IPSS Score: 27 (as documented in the provider consult note)   The patient was not available for call today. Voicemail left.  Patient (has a scheduled follow up visit with his urologist, Dr. Garald Braver, at the [facility] for ongoing surveillance. He was counseled that PSA levels will be drawn in the urology office, and was reassured that additional time is expected to improve bowel and bladder symptoms. He was encouraged to call back with concerns or questions regarding radiation.    [name], LPN

## 2022-08-05 ENCOUNTER — Ambulatory Visit
Admission: RE | Admit: 2022-08-05 | Discharge: 2022-08-05 | Disposition: A | Discharge status: Home / Self Care | Payer: No Typology Code available for payment source | Source: Ambulatory Visit | Attending: Radiation Oncology | Admitting: Radiation Oncology

## 2022-08-05 DIAGNOSIS — C61 Malignant neoplasm of prostate: Secondary | ICD-10-CM | POA: Insufficient documentation

## 2023-01-04 IMAGING — DX DG CHEST 1V PORT
1 series · 1 of 1 positions shown · non-contrast
Comparison: 09/04/2011

CLINICAL DATA: Placement of right IJ dialysis catheter.

EXAM:
PORTABLE CHEST 1 VIEW

[chest]
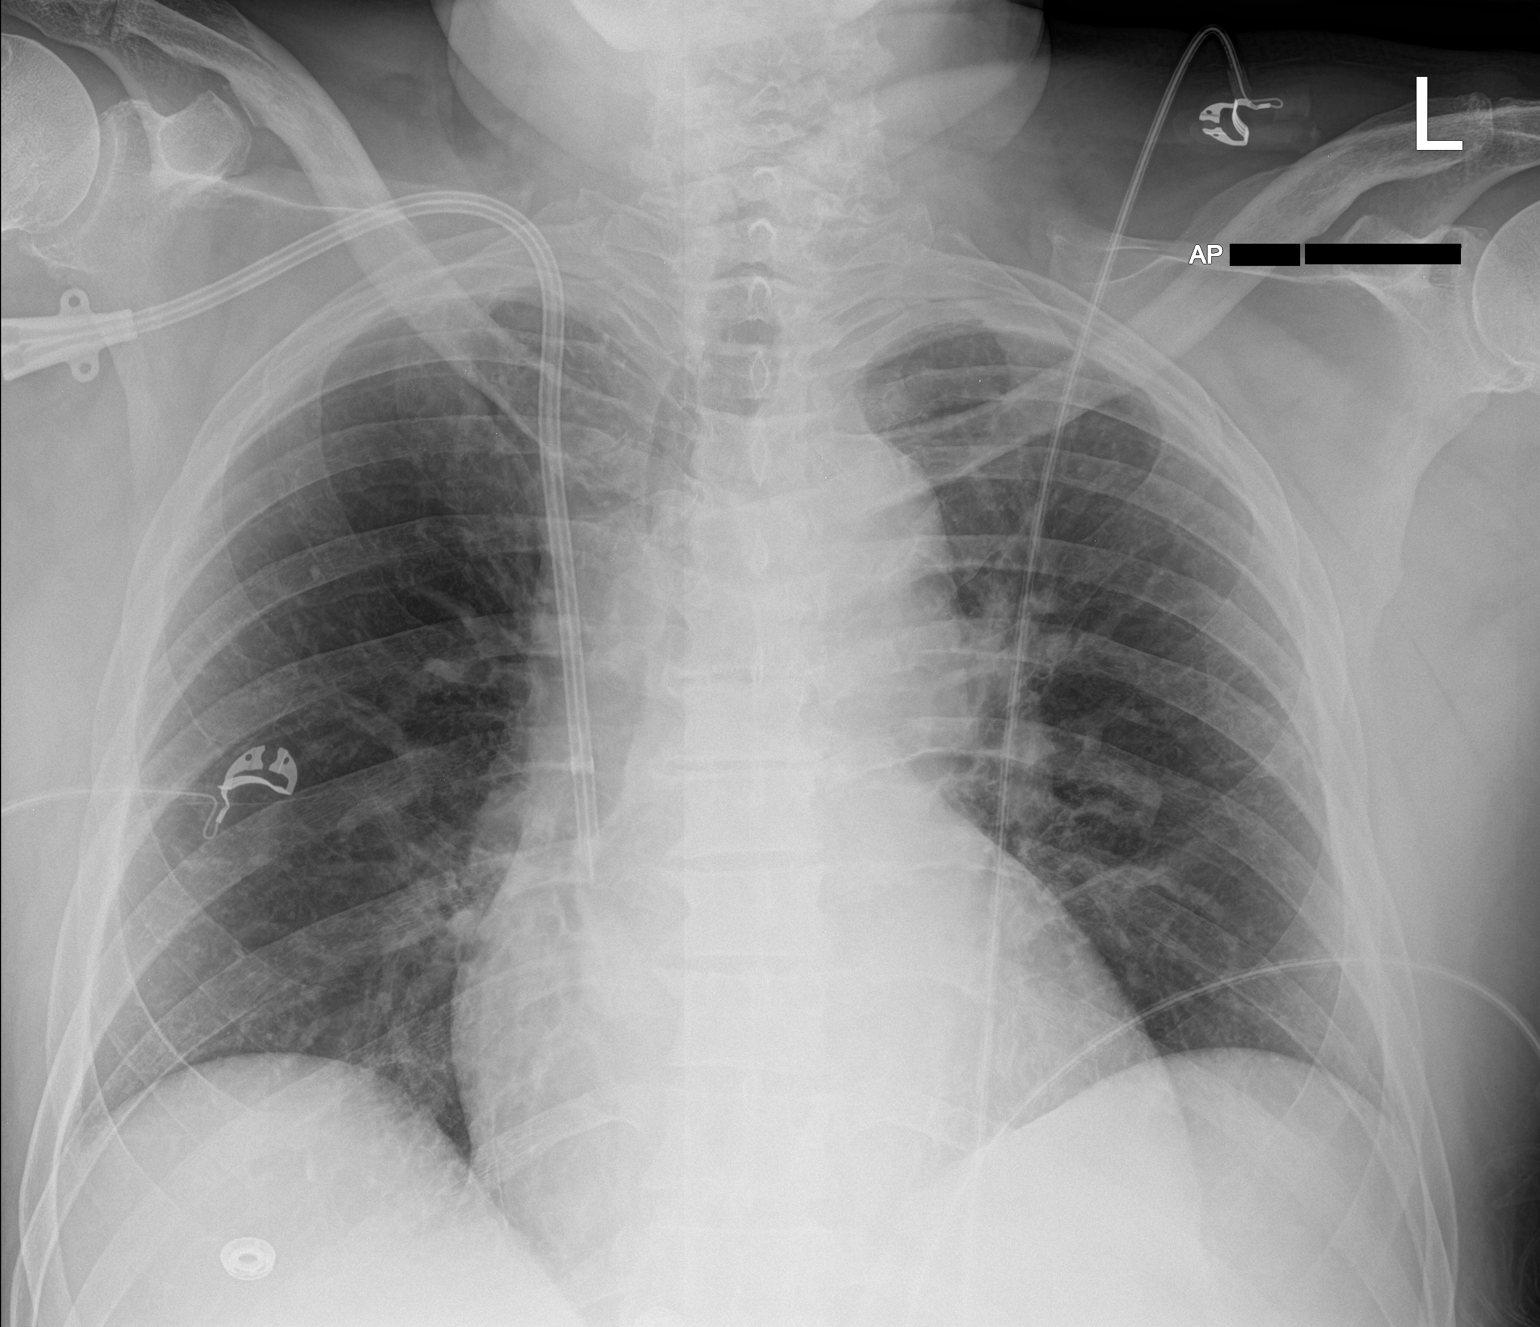

[1 of 1 positions shown; findings below may reference images not displayed]

FINDINGS: The right IJ dialysis catheter is in good position with its tip is
near the cavoatrial junction. No complicating features such as
pneumothorax or hematoma is identified.

The cardiac silhouette, mediastinal and hilar contours are within
normal limits given the AP projection and portable technique.

No acute pulmonary findings. No pleural effusions. No pulmonary
lesions.
IMPRESSION: 1. Right IJ dialysis catheter in good position without complicating
features.
2. No acute cardiopulmonary findings.

## 2024-02-20 ENCOUNTER — Encounter (HOSPITAL_COMMUNITY): Payer: Self-pay

## 2024-02-20 ENCOUNTER — Inpatient Hospital Stay: Admit: 2024-02-20 | Admitting: Family Medicine

## 2024-02-21 NOTE — Progress Notes (Signed)
 NICS Progress Note [facility] General Medicine Progress Note  Date of Admission:  02/20/2024 Length of Stay:  1 Days  Summary  Timothy Lane is a 74 year old male with history of EtOH abuse, BPH, type 2 diabetes mellitus, gout, hypertension, hyperlipidemia, chronic hep C treated, history of cirrhosis of liver, end-stage renal disease on dialysis Monday ,Fridays. He presented to [facility] in [place] for abdominal pain for 8 days. CT abdomen/pelvis without IV contrast showed organized air-fluid collection within the pelvis suggestive of hollow viscus perforation. Surrounding small bowel thickening and sigmoid colon wall thickening or either reactive or suggestive of perforated enteritis or sigmoid diverticulitis. General surgery recommended IR guided abscess drainage. He was transferred to [facility] for further IR intervention for pelvic abscess. IR, general surgery and nephrology have been consulted.   Subjective/Events Overnight:  Patient was sitting up in room in no acute distress. He states he has had improvements in his pain overnight. He is still experiencing some abdominal pain but significant reduction from yesterday. He denies CP, dizziness, SOB, cough, nausea, vomiting, diarrhea. Patient reports he would like to have NPO status readdressed as he is hungry.     Assessment/Plan:   Principal Problem:   Perforated sigmoid diverticulitis with abscess Active Problems:   Abdominal visceral abscess (*)   ESRD on dialysis (*)   T2DM (type 2 diabetes mellitus) (*)   Hypertension   Chronic hepatitis C with cirrhosis, treated   Gout   Anemia of chronic disease  Plan: Perforated sigmoid diverticulitis with abscess -Patient has had significant improvements in pain since yesterday.  -General surgery consulted recommended IR drain for now and Briefly discussed with patient the need for Hartmann's if perc drain unsuccessful.  -IR consulted and performed CT  guided pelvic drain placement -Continue Zosyn -Continue PRN pain management with IV dilaudid  for severe, Add oral Roxicodone  for moderate, Tylenol  for mild  -Advance diet as tolerated to clear liquid   ESRD on dialysis Anemia of chronic disease  -Nephrology consulted today and states patient dialyzed overnight given missed HD session on Friday  -Hgb 1/17 9.7 -Continue iron supplementation -Repeat CBC   HTN -Patient had elevated BP that has resolved after dialysis.  -Continue Norvasc -hold HCTZ  -prn hydralazine   T2DM  -A1C 6.6 -Continue to hold insulin  and monitor glucose   Gout -Continue allopurinol   Chronic hepatitis C with cirrhosis -Patient has a history of alcohol abuse and chronic hep C treated.  -Repeat CMP     Fluids, electrolytes, nutrition Fluids: Diet:    Prophylaxis DVT:     Heparin    Disposition  Home vs SNF   Discussed plan of care with the attending MD  I have discussed the diagnoses and care plan with the patient     I personally viewed the chart :  medical history and diagnosis, vital signs, labs, diagnostic imagining and interpretations, EKG, medications and orders. Addressed any pertinent results. Details found in plan and assessment.      Objective   Vital signs in last 24 hours:  Temp:  [98.2 F (36.8 C)-99 F (37.2 C)] 98.9 F (37.2 C) Heart Rate:  [75-99] 77 Resp:  [16-35] 30 BP: (110-203)/(53-103) 115/57 SpO2:  [92 %-97 %] 96 %    Wt Readings from Last 1 Encounters:  02/20/24 93 kg (205 lb)    Weight change:    Physical Exam  Constitutional - resting comfortably, no acute distress CV - (+)S1S2, no murmurs, no peripheral edema, no JVD  Resp - CTA bilaterally, no wheezing or crackles, no clubbing, cyanosis  GI - (+)BS, soft, non-tender, non-distended, dressing in place  Skin - no rashes or wounds Neuro - alert, aware, oriented to person/place/time       Recent Labs    Units 02/20/24 1413  WBC 10e3cells/uL 8.9  HGB  gm/dL 9.7*  HCT % 69.8*  PLT 10e3cells/uL 185   Recent Labs    Units 02/21/24 0606  NA mmol/L 136  K mmol/L 4.4  CL mmol/L 92*  CO2 mmol/L 24  BUN mg/dL 37*  CREATININE mg/dL 1.82*  CALCIUM mg/dL 8.7   No results for input(s): MAGNESIUM in the last 168 hours. Recent Labs    Units 02/21/24 0606  HGBA1C % 6.6*   Recent Labs    Units 02/21/24 0606  BILITOT mg/dL 1.0  AST IU/L 24  ALT IU/L 18  ALKPHOS IU/L 73  ALBUMIN gm/dL 2.8*   Recent Labs    Units 02/21/24 0606 02/20/24 1532  INR  1.6 1.6  PTT second(s)  --  34   No results for input(s): CHOL, LDL, HDL, TRIG in the last 168 hours. Recent Labs    Units 02/21/24 1208 02/21/24 0804 02/21/24 0606 02/21/24 0418 02/20/24 2320 02/20/24 2240  GLUCOSE mg/dL 98 96 93 85 84 84   No results for input(s): TROPONIN, CK in the last 168 hours.  Invalid input(s): CK-MB No results for input(s): BNP in the last 168 hours.   Current Medications   allopurinol  100 mg Oral Daily   amLODIPine besylate  10 mg Oral Daily   ferrous sulfate  325 mg Oral BRK   gabapentin   100 mg Oral TID   heparin   5,000 Units Subcutaneous Q8H 07-17-20   hydroCHLOROthiazide   25 mg Oral Daily   insulin  lispro (HUMALOG,ADMELOG)  1-6 Units Subcutaneous Meals   piperacillin-tazabactam  4.5 g IntraVENous Q12H SCH    acetaminophen  **OR** acetaminophen , albuterol sulfate, calcium carbonate, dextrose  **OR** dextrose  **OR** dextrose  **OR** glucagon, diphenoxylate-atropine, guaiFENesin , hydrALAZINE  HCl, HYDROmorphone , melatonin, NaCl, NaCl, ondansetron  **OR** ondansetron , polyethylene glycol  Estefana Mody  02/21/2024 1:19 PM

## 2024-02-21 NOTE — Progress Notes (Signed)
 [facility] Physical Therapy - Exception to Therapy  Patient Name:  Lige Lakeman Date of Birth:  [birth year]  Today's Date: February 21, 2024  Charting Type: Evaluation Exception PT session unable to be completed secondary to: medical issues/pending test results.  Nursing advised PT to hold because of difficulty getting pt's pain under control.     Will continue efforts as appropriate.   Time treatment/evaluation attempted: 1504  Charges:  $ Attempted Patient Visit PT: 1 Unit  Lauraine Cruel, PT 02/21/2024 3:32 PM

## 2024-02-22 NOTE — Progress Notes (Signed)
 [facility] Physical Therapy - Initial Evaluation  Patient Name:  Timothy Lane Date of Birth:  [birth year]  Today's Date: February 22, 2024  PT Diagnosis: Other abnormalities of gait and mobility;Unsteadiness on feet;Difficulty walking  Assessment    Prior level of function Current level of mobility Current functional limitations/ impairments Rehab potential  Independent with ambulation, Independent with functional transfers   Min- light physical assist   Decline in ambulation;Decreased handling tolerance;Impaired balance;Fall risk;Gait instability;Generalized weakness;Decreased functional mobility;Decreased activity tolerance Good   Chesney Klimaszewski is a 74 year old male with history of EtOH abuse, BPH, type 2 diabetes mellitus, gout, hypertension, hyperlipidemia, chronic hep C treated, history of cirrhosis of liver, end-stage renal disease on dialysis. He presented to [facility] in [place] for abdominal pain for 8 days. CT abdomen/pelvis without IV contrast showed organized air-fluid collection within the pelvis suggestive of hollow viscus perforation. Pt s/p Pelvic drain placement 1/18.  Pt presents to PT evaluation with generalized weakness, gait instability, decreased activity tolerance, and fatigue with functional ambulation.  Pt will benefit from continued PT while admitted to [facility] to further progress his functional mobility and activity tolerance.  Plan for Current Admission   Needs during current admission Treatment/Interventions PT Frequency Duration of Treatment  Continued skilled PT   Bed mobility;Therapeutic exercise/strengthening;Transfer training;Endurance training;Patient/support person education;Balance training;Gait training;Functional mobility training;Neuromuscular re-education 3x/wk  Duration of Treatment start date:: 02/22/24 Duration of Treatment end date:: 03/24/24     Recommendations for Discharge   Anticipated PT needs at  discharge Anticipated caregiver needs at discharge DME Recommendations  Home Health PT (if qualifications met)  None, patient independent, Supervision for safety/cues  No DME needs anticipated for mobility;Has all needed equipment    PT Barriers to Discharge Home  Taxing effort to leave home or perform community mobility;Decreased activity tolerance requiring frequent rest breaks   *Actual disposition location dependent on change in patient's functional status, patient/caregiver preferences, medical needs, bed availability, and insurance approvals  Subjective  Pt received semi-reclined in hospital bed, agreeable to PT evaluation.  RN ok'd tx.   History of Present Illness/Pertinent Information: Timothy Lane is a 75 year old male with history of EtOH abuse, BPH, type 2 diabetes mellitus, gout, hypertension, hyperlipidemia, chronic hep C treated, history of cirrhosis of liver, end-stage renal disease on dialysis Monday ,Fridays. He presented to [facility] in [place] for abdominal pain for 8 days. CT abdomen/pelvis without IV contrast showed organized air-fluid collection within the pelvis suggestive of hollow viscus perforation. Surrounding small bowel thickening and sigmoid colon wall thickening or either reactive or suggestive of perforated enteritis or sigmoid diverticulitis. General surgery recommended IR guided abscess drainage. He was transferred to [facility] for further IR intervention for pelvic abscess. IR, general surgery and nephrology have been consulted.   Problem List Problem List[1] History Past Medical History:  Diagnosis Date   BPH (benign prostatic hyperplasia)    Cirrhosis (*)    Diabetes mellitus (*)    Gout    Hepatitis C    History of ETOH abuse    Hyperlipemia    Hypertension    Renal disorder    chronic kidney disease   Past Surgical History:  Procedure Laterality Date   Joint replacement       Objective  Activity Tolerance Activity  Tolerance O2 Device: None (Room air)   Precautions  Other Precautions: Falls, PIV, JP drain Precautions discussed with:: Patient;Nurse  Home Living Type of Home: House Home Layout: One level Home Equipment:  Rolling walker;Single point cane;Bedside commode  Prior Function Level of Independence: Independent with ambulation;Independent with functional transfers Lives With: Alone Receives help from: None needed, pt has two daughters who can assist at D/C  Pain Pain Assessment Pain Score:  (3/10 abdominal pain)  Cognition/Communication  Cognition Overall Cognitive Status: Within Functional Limits Communication Communication: Intact  Vision/Hearing Vision Current vision: Intact Hearing Hearing: Intact  Sensation Sensation: Within functional limits   Extremity Assessment  AROM AROM Assessment: WFL    PROM PROM Assessment: [facility]    Strength Strength Assessment: WFL Comments: BLE's 5/5     Tone Tone Assessment: WFL     Coordination Gross Motor: WFL   Posture Posture: Forward Head;Forward flexed trunk  Mobility Bed Mobility Supine to Sit: Moderate assistance;Left Comments: ModA for anterior trunk translation, pt able to progress BLE's   Transfers Sit to/from Stand: Minimal assistance;Supervision Comments: minA x 1 from EOB w HHA, x 1 from EOB CS with RW, x 1 from [facility] with RW req CS   Gait/Ambulation Pattern: Decreased cadence;Decreased step length - L;Decreased step length - R;Foward flexed Gait Assistance: Supervision Assistive Device: Rolling walker Gait Distance (ft) 1: 50 Gait Distance (ft) 2: 15 Comments: Facilitated gait training with RW req close supervision throughout.  Pt with decreased functional endurance req seated recovery between trials.    Balance Sitting - Static: Supervision Sitting - Dynamic: Supervision Standing - Static: Supervision Standing - Dynamic: Supervision Comments: RW for standing  Exercises  No exercises performed   Today's  Treatment Treatment: Functional mobility training;Therapeutic exercise;Patient education;Gait training;Balance training  Functional Tests AM-PAC Basic Mobility Turning from your back to your side while in a flat bed without using bedrails?: A little Moving from lying on your back to sitting on the side of a flat bed without using bedrails?: A lot Moving to and from a bed to a chair (including a wheelchair)?: A little Standing up from a chair using your arms (e.g., wheelchair, or bedside chair)? : A little Walking in hospital room?: A little Climbing 3-5 steps with a railing?: A lot AM-PAC Basic Mobility Raw Score (out of 24): 16 Routine Mobility Goal: Standing 1 OR More Minutes & Wash Face, Comb Hair, Shave, Brush Teeth 5 [facility] Highest level of Mobility (JH-HLM) rating from treatment session: 7- Walk 25 ft or more     Patient left in chair with all lines/leads intact. Call bell and other needs in reach and chair alarm in place and engaged.  Goals  PT - Patient/Support Person Stated Goals: To return to [facility]   Physical Therapy Care Plan (Active)     Problem: Mobility, Impaired     Dates: Start:  02/22/24       Goal: Bed Mobility     Dates: Start:  02/22/24    Expected End:  03/24/24      Description: Patient will perform bed mobility with independent assistance.      Goal: Transfers     Dates: Start:  02/22/24    Expected End:  03/24/24      Description: Patient will perform sit<>stand transfer with independent assistance.      Goal: Ambulation     Dates: Start:  02/22/24    Expected End:  03/24/24      Description: Patient will ambulate 300 feet with no AD with independent assistance.        Key (I=independent, ModI=modified independent, S=supervision, CGA=contact guard assist, Min=minimal assist, Mod=moderate assist, Max=maximal assist, D= dependent)   Plan of  Care was discussed with and agreed upon by Patient/Caregiver: Yes  Evaluation / Treatment Time   Today's Evaluation/Treatment: 1317 - 1358 Total Time: 41 min Treatment Day: 1   Charges  Total Time Code Treatment Minutes: 31  Evaluation Charges $ PT Evaluation: Low Complex Therapeutic Charges $ Gait training: 1 unit $ Therapeutic Activity: 1 unit  Andriette JULIANNA Ivan, PT, DPT 02/22/2024 3:44 PM      [1] Patient Active Problem List Diagnosis   Abdominal visceral abscess (*)   Perforated sigmoid diverticulitis with abscess   ESRD on dialysis (*)   T2DM (type 2 diabetes mellitus) (*)   Hypertension   Chronic hepatitis C with cirrhosis, treated   Gout   Anemia of chronic disease

## 2024-02-22 NOTE — Progress Notes (Signed)
 NICS Progress Note [facility] General Medicine Progress Note  Date of Admission:  02/20/2024 Length of Stay:  2 Days  Summary  Timothy Lane is a 74 year old male with history of EtOH abuse, BPH, type 2 diabetes mellitus, gout, hypertension, hyperlipidemia, chronic hep C treated, history of cirrhosis of liver, end-stage renal disease on dialysis Monday ,Fridays. He presented to [facility] in [place] for abdominal pain for 8 days. CT abdomen/pelvis without IV contrast showed organized air-fluid collection within the pelvis suggestive of hollow viscus perforation. Surrounding small bowel thickening and sigmoid colon wall thickening or either reactive or suggestive of perforated enteritis or sigmoid diverticulitis. General surgery recommended IR guided abscess drainage. He was transferred to [facility] for further IR intervention for pelvic abscess. IR, general surgery and nephrology have been consulted.   Reasons for continued hospitalization: On going infection, bowel Perforation, Might need surgical intervention   Disposition to: Home when stable   Timing of discharge: TBD   Subjective/Events Overnight:  Patient was having hemodialysis done during interaction but was in no acute distress. He had fever and worsening abdominal pain yesterday. CT was performed. He was transferred to [facility]. Patient was still in significant pain today and reports 10/10 abdominal pain. He denies CP, dizziness, palpitations, SOB, cough, nausea, vomiting, diarrhea.     Assessment/Plan:   Principal Problem:   Perforated sigmoid diverticulitis with abscess Active Problems:   Abdominal visceral abscess (*)   ESRD on dialysis (*)   T2DM (type 2 diabetes mellitus) (*)   Hypertension   Chronic hepatitis C with cirrhosis, treated   Gout   Anemia of chronic disease   Plan: Perforated sigmoid diverticulitis with abscess -General surgery consulted on admission recommended IR abscess  drain  -IR consulted and performed CT guided pelvic drain placement on 1/18 -Patient developed 10/10 pain after drain placement along with episode of fever  -STAT CT performed on 1/18 showed probable acute hemorrhage inside the left pelvic abscess, Increase in size of the left pelvic abscess, Worsening small bowel obstruction.  -General surgery evaluated patient and he was given TXA and Vit K, and placed NPO status. -heparin  held  -IR re consulted on 1/19 for drain repositioning - consider ex-lap/pelvic abscess washout, possible Hartmann's procedure if conservative mgt w/ drain/bowel rest and repositioning IR guided JP drain is unsuccessful per General surgery  -Continue Zosyn -Continue Scheduled Tylenol  PRN pain management with IV dilaudid  for severe, oral Roxicodone  for moderate,   ESRD on dialysis Anemia of chronic disease  -Nephrology consulted and patient received hemodialysis 1/19 -Hgb 8.3 -Continue iron supplementation -Repeat CBC    HTN -Continue Norvasc -Hold HCTZ  -prn hydralazine    T2DM  -A1C 6.6 -Continue to hold insulin  and monitor glucose    Gout -Continue allopurinol    Chronic hepatitis C with cirrhosis -Patient has a history of alcohol abuse and chronic hep C treated.  -Repeat CMP  Fluids, electrolytes, nutrition Fluids: Diet: NPO time specified Except: May have sips of water, May have ice chips, Meds   Prophylaxis DVT:        Disposition Home vs SNF  Discussed plan of care with the attending MD  I have discussed the diagnoses and care plan with the patient     I personally viewed the chart :  medical history and diagnosis, vital signs, labs, diagnostic imagining and interpretations, EKG, medications and orders. Addressed any pertinent results. Details found in plan and assessment.      Objective   Vital  signs in last 24 hours:  Temp:  [97.6 F (36.4 C)-101.2 F (38.4 C)] 97.8 F (36.6 C) Heart Rate:  [71-104] 84 Resp:  [16-22] 19 BP:  (105-188)/(50-84) 132/57 SpO2:  [80 %-100 %] 95 %    Wt Readings from Last 1 Encounters:  02/20/24 93 kg (205 lb)    Weight change:    Physical Exam  Constitutional - resting comfortably, no acute distress CV - (+)S1S2, no murmurs, no peripheral edema, no JVD  Resp - CTA bilaterally, no wheezing or crackles, no clubbing, cyanosis  GI - hypoactive BS- Positive for diffused tenderness  Skin - no rashes or wounds Neuro - alert, aware, oriented to person/place/time      Recent Labs    Units 02/22/24 0805  WBC 10e3cells/uL 7.5  HGB gm/dL 8.3*  HCT % 74.6*  PLT 10e3cells/uL 173   Recent Labs    Units 02/22/24 0805  NA mmol/L 134*  K mmol/L 4.2  CL mmol/L 89*  CO2 mmol/L 25  BUN mg/dL 42*  CREATININE mg/dL 0.81*  CALCIUM mg/dL 8.9   No results for input(s): MAGNESIUM in the last 168 hours. Recent Labs    Units 02/21/24 0606  HGBA1C % 6.6*   Recent Labs    Units 02/22/24 0805  BILITOT mg/dL 1.2  AST IU/L 26  ALT IU/L 18  ALKPHOS IU/L 74  ALBUMIN gm/dL 2.7*   Recent Labs    Units 02/22/24 0805  INR  1.6  PTT second(s) 33   No results for input(s): CHOL, LDL, HDL, TRIG in the last 168 hours. Recent Labs    Units 02/22/24 0844 02/22/24 0805 02/21/24 2016 02/21/24 1648 02/21/24 1541 02/21/24 1208  GLUCOSE mg/dL 862* 851* 824* 809* 837* 98   No results for input(s): TROPONIN, CK in the last 168 hours.  Invalid input(s): CK-MB No results for input(s): BNP in the last 168 hours.   Current Medications   acetaminophen   1,000 mg Oral Q8H [id number]   allopurinol  100 mg Oral Daily   amLODIPine besylate  10 mg Oral Daily   epoetin alfa-epbx  7,000 Units IntraVENous Once per day on Monday Wednesday Friday   ferrous sulfate  325 mg Oral BRK   gabapentin   100 mg Oral TID   [Held by Provider] heparin   5,000 Units Subcutaneous Q8H 07-17-20   [Held by Provider] hydroCHLOROthiazide   25 mg Oral Daily   [Held by Provider] insulin  lispro  (HUMALOG,ADMELOG)  1-6 Units Subcutaneous Meals   methocarbamol   500 mg Oral QID   piperacillin-tazabactam  4.5 g IntraVENous Q12H SCH    albuterol sulfate, calcium carbonate, dextrose  **OR** dextrose  **OR** dextrose  **OR** glucagon, diphenoxylate-atropine, guaiFENesin , hydrALAZINE  HCl, HYDROmorphone , melatonin, NaCl, NaCl, ondansetron  **OR** ondansetron , oxyCODONE  HCl, polyethylene glycol  Estefana Mody  02/22/2024 12:13 PM

## 2024-02-22 NOTE — Progress Notes (Signed)
 [facility]  Progress Note  Assessment/Plan  Active Hospital Problems   Gout    Anemia of chronic disease    Abdominal visceral abscess (*)    *Perforated sigmoid diverticulitis with abscess    ESRD on dialysis (*)    T2DM (type 2 diabetes mellitus) (*)    Hypertension    Chronic hepatitis C with cirrhosis, treated  PLAN:  ESRD related to DM/HTN presenting with sigmoid abscess from perf diverticuli   Gen Surg eval reviewed Perc drainage of divertic abscess in IR over weekend Empiric IV Abx continue No immediate surgical plans it seems   [facility] [place] [facility] RUE aVF intact; 15g; Qb ~ 400 No Heparin  in use 2 K bath  UF goal minimal (1 kg below outpt EDW) No acid/base concerns Stable oxygenation on RA SBP soft ~ 105-110 Hgb ~ 8 g Adding ESA now (receives at [place] despite prostate CA)     Interval History  History of Present Illness  Seen during maint HD this AM. Alert in NAD. Below EDW thus min UF goal today. Mild abd pain @ drain site persists.    Infusions:  Medications:   acetaminophen   1,000 mg Oral Q8H 928476   allopurinol  100 mg Oral Daily   amLODIPine besylate  10 mg Oral Daily   ferrous sulfate  325 mg Oral BRK   gabapentin   100 mg Oral TID   [Held by Provider] heparin   5,000 Units Subcutaneous Q8H 07-17-20   [Held by Provider] hydroCHLOROthiazide   25 mg Oral Daily   [Held by Provider] insulin  lispro (HUMALOG,ADMELOG)  1-6 Units Subcutaneous Meals   methocarbamol   500 mg Oral QID   piperacillin-tazabactam  4.5 g IntraVENous Q12H [facility]   Physical Examination  Temp:  [97.6 F (36.4 C)-101.2 F (38.4 C)] 97.6 F (36.4 C) Heart Rate:  [71-104] 78 Resp:  [16-35] 20 BP: (107-188)/(50-84) 107/58 SpO2:  [80 %-98 %] 92 % Pain Score: 10-Worst pain ever O2 Device: None (Room air) No data recorded   Intakes & Outputs (Last 24 hours) at 02/22/2024 0700 Last data filed at 02/22/2024 0439 24hr  Volume @0700   Intake --  Output 440 mL  Net -440 mL       Physical Exam Constitutional:      General: He is not in acute distress.    Appearance: He is obese.  HENT:     Head: Normocephalic.  Eyes:     Extraocular Movements: Extraocular movements intact.     Pupils: Pupils are equal, round, and reactive to light.  Cardiovascular:     Rate and Rhythm: Normal rate and regular rhythm.     Heart sounds: Murmur heard.     Comments: RUE aVF intact with good bruit Musculoskeletal:     Right lower leg: No edema.     Left lower leg: No edema.  Pulmonary:     Effort: Pulmonary effort is normal.     Breath sounds: Rhonchi present. No wheezing.  Abdominal:     Palpations: Abdomen is soft.     Tenderness: There is abdominal tenderness.     Comments: Lower right quad drain intact; JP bulb with scant bloody discharge noted  Neurological:     General: No focal deficit present.     Mental Status: He is alert and oriented to person, place, and time.   Patient Lines/Drains/Airways Status     Active Lines     Name Placement date Placement time Site  Days   Peripheral IV 22 G Distal;Left;Posterior Forearm 02/20/24  1214  Forearm  1   Peripheral IV 22 G Anterior;Left Forearm 02/21/24  2137  Forearm  less than 1           Results  Labs: Recent Results (from the past 24 hours)  Culture, Deep Wound Abscess Pelvis   Collection Time: 02/21/24 10:54 AM   Specimen: Pelvis; Abscess  Result Value Ref Range   Gram Stain Result Few Polymorphonuclear leukocytes    Gram Stain Result      Moderate Gram positive cocci in pairs, chains and clusters   Gram Stain Result Few Gram positive bacilli    Gram Stain Result Rare Gram negative bacilli   If new NPO order, complete POCT Glucose every 4 hours   Collection Time: 02/21/24 12:08 PM  Result Value Ref Range   Glucose, POC 98 70 - 99 mg/dL   OPERATOR ID [id number]    INSTRUMENT ID [id number]   CBC   Collection Time: 02/21/24  3:41 PM  Result  Value Ref Range   WBC 10.5 4.0 - 10.5 10e3cells/uL   RBC 3.53 (L) 4.63 - 6.08 10e6cells/uL   HGB 10.0 (L) 13.7 - 17.5 gm/dL   HCT 68.8 (L) 59.8 - 48.9 %   MCV 88.1 79.0 - 92.2 fL   MCH 28.3 25.7 - 32.2 pg   MCHC 32.2 (L) 32.3 - 36.5 gm/dL   Plt Ct 799 849 - 599 10e3cells/uL   RDW SD 57.5 (H) 35.1 - 46.3 fL   MPV 10.8 9.4 - 12.4 fL   NRBC% 0.0 0.0 - 0.2 /100WBC   Absolute NRBC Count 0.00 0.00 - 0.01 thou/mcL  Lactic Acid - Yes reflex 2 hour   Collection Time: 02/21/24  3:41 PM  Result Value Ref Range   Lactic Acid 2.0 0.5 - 2.0 mmol/L  Comprehensive Metabolic Panel   Collection Time: 02/21/24  3:41 PM  Result Value Ref Range   Na 137 136 - 146 mmol/L   Potassium 4.9 3.7 - 5.4 mmol/L   Cl 89 (L) 97 - 108 mmol/L   CO2 22 20 - 32 mmol/L   AGAP 26 (H) 7 - 16 mmol/L   Glucose 162 (H) 65 - 99 mg/dL   BUN 44 (H) 8 - 27 mg/dL   Creatinine 0.95 (H) 9.23 - 1.27 mg/dL   Ca 9.2 8.6 - 89.7 mg/dL   ALK PHOS 98 25 - 839 IU/L   T Bili 1.3 (H) 0.0 - 1.2 mg/dL   Total Protein 7.4 6.0 - 8.5 gm/dL   Alb 3.1 (L) 3.5 - 4.8 gm/dL   GLOBULIN 4.3 1.5 - 4.5 gm/dL   ALBUMIN/GLOBULIN RATIO 0.7 (L) 1.1 - 2.5   BUN/CREAT RATIO 5 (L) 11 - 26   ALT 20 0 - 55 IU/L   AST 32 0 - 40 IU/L   eGFR 6 (L) >=60 mL/min/1.30m2  POCT Glucose 30  minutes before meals and at bedtime   Collection Time: 02/21/24  4:48 PM  Result Value Ref Range   Glucose, POC 190 (H) 70 - 99 mg/dL   OPERATOR ID [id number]    INSTRUMENT ID [id number]   Hemoglobin And Hematocrit, Blood   Collection Time: 02/21/24  6:30 PM  Result Value Ref Range   HGB 8.9 (L) 13.7 - 17.5 gm/dL   HCT 72.3 (L) 59.8 - 48.9 %  If new NPO order, complete POCT Glucose every 4 hours   Collection Time: 02/21/24  8:16 PM  Result Value Ref Range   Glucose, POC 175 (H) 70 - 99 mg/dL   OPERATOR ID [id number]    INSTRUMENT ID [id number]   Hemoglobin And Hematocrit, Blood   Collection Time: 02/22/24  1:11 AM  Result Value Ref Range   HGB 8.6 (L) 13.7 - 17.5  gm/dL   HCT 73.0 (L) 59.8 - 48.9 %  CBC And Differential   Collection Time: 02/22/24  1:11 AM  Result Value Ref Range   WBC 11.4 (H) 4.0 - 10.5 10e3cells/uL   RBC 3.02 (L) 4.63 - 6.08 10e6cells/uL   HGB 8.4 (L) 13.7 - 17.5 gm/dL   HCT 73.6 (L) 59.8 - 48.9 %   MCV 87.1 79.0 - 92.2 fL   MCH 27.8 25.7 - 32.2 pg   MCHC 31.9 (L) 32.3 - 36.5 gm/dL   Plt Ct 823 849 - 599 10e3cells/uL   RDW SD 56.6 (H) 35.1 - 46.3 fL   MPV 10.1 9.4 - 12.4 fL   NRBC% 0.0 0.0 - 0.2 /100WBC   Absolute NRBC Count 0.00 0.00 - 0.01 thou/mcL   NEUTROPHIL % 88.0 %   LYMPHOCYTE % 3.5 %   MONOCYTE % 6.6 %   Eosinophil % 0.8 %   BASOPHIL % 0.2 %   IG% 0.9 %   ABSOLUTE NEUTROPHIL COUNT 10.08 (H) 1.50 - 7.50 thou/mcL   ABSOLUTE LYMPHOCYTE COUNT 0.40 (L) 1.00 - 4.50 thou/mcL   Absolute Monocyte Count 0.75 0.10 - 0.80 thou/mcL   Absolute Eosinophil Count 0.09 0.00 - 0.50 thou/mcL   Absolute Basophil Count 0.02 0.00 - 0.20 thou/mcL   Absolute Immature Granulocyte Count 0.10 (H) 0.00 - 0.03 thou/mcL   CBC   Collection Time: 02/22/24  8:05 AM  Result Value Ref Range   WBC 7.5 4.0 - 10.5 10e3cells/uL   RBC 2.91 (L) 4.63 - 6.08 10e6cells/uL   HGB 8.3 (L) 13.7 - 17.5 gm/dL   HCT 74.6 (L) 59.8 - 48.9 %   MCV 86.9 79.0 - 92.2 fL   MCH 28.5 25.7 - 32.2 pg   MCHC 32.8 32.3 - 36.5 gm/dL   Plt Ct 826 849 - 599 10e3cells/uL   RDW SD 56.0 (H) 35.1 - 46.3 fL   MPV 10.4 9.4 - 12.4 fL   NRBC% 0.0 0.0 - 0.2 /100WBC   Absolute NRBC Count 0.00 0.00 - 0.01 thou/mcL  Protime-INR   Collection Time: 02/22/24  8:05 AM  Result Value Ref Range   PT 18.5 (H) 11.8 - 14.3 second(s)   INR 1.6 See Therapeutic ranges  PTT   Collection Time: 02/22/24  8:05 AM  Result Value Ref Range   PTT 33 22 - 35 second(s)   Imaging: CT Abdomen Pelvis W IV Contrast Result Date: 02/21/2024 ABDOMEN AND PELVIS CT WITH INTRAVENOUS CONTRAST: TECHNIQUE: Multiple axial CT images of the abdomen and pelvis were acquired after the administration of    100  mL of IOPAMIDOL 76 % IV SOLN intravenous contrast. Coronal and sagittal images were obtained. CT dose reduction techniques utilized. PROVIDED CLINICAL INDICATION: Abdominal Pain ADDITIONAL CLINICAL INDICATION: None available COMPARISON: 02/20/2024 INTERPRETATION:   The lung bases are within normal limits. ABDOMEN: Stable splenomegaly. Stable pancreatic atrophy. The spleen measures 14.5 x 5 cm. Gallbladder is normal. Atrophic and multicystic kidneys bilaterally, stable from prior. The retroperitoneum is unremarkable. The major vascular structures are age appropriate.  PELVIS: There is a drainage catheter in the anterior left pelvic collection in good position. There is some hyperdensity in this collection now  which may indicate acute contrast extravasation. The source is not clear. Worsening small bowel obstruction. The pelvic collection has increased in size. This now measures 6.5 x 11.5 cm. The pelvic organs are within normal limits.  Small amount of ascites.   IMPRESSION: Probable acute hemorrhage inside the left pelvic abscess. Increase in size of the left pelvic abscess. Worsening small bowel obstruction. ###CODE CRITICAL REPORT### (automated ordering provider notification pathway initiated at 02/21/2024 5:30 PM) Electronically Signed by: Marty Roger MD, PHD on 02/21/2024 5:30 PM   Coding  Electronically signed: Dallas JAYSON Perry, MD 02/22/2024 / 8:45 AM

## 2024-02-23 NOTE — Pre-Procedure Assessment (Signed)
 " Pre-Procedural Evaluation (Moderate Sedation)   [facility] Procedure Details   Requested/Planned Procedure: Image guided pelvic abscess drain upsize  Indication/Diagnosis: Pelvic abscess   History  Chief Complaint:No chief complaint on file.  Problem List[1] History of Present Illness:  Past Medical History:  Diagnosis Date   BPH (benign prostatic hyperplasia)    Cirrhosis (*)    Diabetes mellitus (*)    Gout    Hepatitis C    History of ETOH abuse    Hyperlipemia    Hypertension    Renal disorder    chronic kidney disease   Past Surgical History:  Procedure Laterality Date   Joint replacement      Social History[2] Family History[3]  Allergies[4]  Infusions:  Medications:   acetaminophen   1,000 mg Oral Q8H 928476   allopurinol  100 mg Oral Daily   amLODIPine besylate  10 mg Oral Daily   epoetin alfa-epbx  7,000 Units IntraVENous Once per day on Monday Wednesday Friday   ferrous sulfate  325 mg Oral BRK   gabapentin   100 mg Oral TID   [Held by Provider] heparin   5,000 Units Subcutaneous Q8H 07-17-20   [Held by Provider] hydroCHLOROthiazide   25 mg Oral Daily   [Held by Provider] insulin  lispro (HUMALOG,ADMELOG)  1-6 Units Subcutaneous Meals   methocarbamol   500 mg Oral QID   piperacillin-tazabactam  4.5 g IntraVENous Q12H [facility]   PRN medications: albuterol sulfate Nebulization,calcium carbonate Oral,dextrose  Oral **OR** dextrose  IntraVENous **OR** dextrose  IntraVENous **OR** glucagon Intramuscular,diphenoxylate-atropine Oral,guaiFENesin  Oral,hydrALAZINE  HCl IntraVENous,HYDROmorphone  IntraVENous,melatonin Oral,NaCl IntraVENous,NaCl IntraVENous,ondansetron  Oral **OR** ondansetron  IntraVENous,oxyCODONE  HCl Oral,polyethylene glycol Oral Home medications:  Prior to Admission medications  Medication Sig Start Date End Date Taking? Authorizing Provider  acyclovir (ZOVIRAX) 5 % ointment Apply 1 application topically  every 3 (three) hours.    Historical Provider, MD  ALBUTEROL IN Inhale into the lungs. Patient not taking: Reported on 02/20/2024    Historical Provider, MD  allopurinol (ZYLOPRIM) 100 mg tablet Take one tablet (100 mg dose) by mouth daily.    Historical Provider, MD  amLODIPine besylate (NORVASC) 10 mg tablet Take one tablet (10 mg dose) by mouth daily.    Historical Provider, MD  amlodipine-benazepril (LOTREL) 10-40 MG per capsule Take one capsule by mouth daily.    Historical Provider, MD  calcitRIOL (ROCALTROL) 0.25 mcg capsule Take one capsule (0.25 mcg dose) by mouth daily.    Historical Provider, MD  cholestyramine ORMA) 4 GM/DOSE powder Take by mouth 3 (three) times daily with meals.    Historical Provider, MD  diclofenac (VOLTAREN) 0.1% ophthalmic solution 1 drop 4 (four) times daily. Patient not taking: Reported on 02/20/2024    Historical Provider, MD  diphenoxylate-atropine (LOMOTIL) 2.5-0.025 mg per tablet Take one tablet by mouth 4 (four) times a day as needed for Diarrhea.    Historical Provider, MD  epoetin alfa (EPOGEN,PROCRIT) 10000 UNIT/ML injection Inject 1 mL (10,000 Units dose) into the skin. At dialysis    Historical Provider, MD  ferrous gluconate 324 (37.5 FE) mg tablet Take one tablet (324 mg dose) by mouth with breakfast.    Historical Provider, MD  finasteride (PROSCAR) 5 mg tablet Take one tablet (5 mg dose) by mouth daily.    Historical Provider, MD  gabapentin  (NEURONTIN ) 100 mg capsule Take one capsule (100 mg dose) by mouth 3 (three) times a day.    Historical Provider, MD  hydrALAZINE  HCl (APRESOLINE ) 50 mg tablet Take one tablet (50 mg dose) by mouth 2 (  two) times daily. 12/01/22   Historical Provider, MD  hydrochlorothiazide  (HYDRODIURIL ) 25 mg tablet Take by mouth.    Historical Provider, MD  Insulin  Aspart Prot & Aspart (NOVOLOG  MIX 70/30) 100 unit/mL injection Inject into the skin. INJECT NOVOLOG  70/30 OF SUBCUTANEOUSLY TWICE A DAY FOR DIABETES WITH MEALS AS  DIRECTED (23 UNITS EACH MORNING AND 17 UNITS EACH EVENING) 10/26/23   Historical Provider, MD  insulin  NPH-insulin  regular (HUMULIN 70/30,NOVOLIN 70/30) (70-30) 100 UNIT/ML injection Inject into the skin.    Historical Provider, MD  loperamide (IMODIUM) 2 MG capsule Take one capsule (2 mg dose) by mouth 4 (four) times a day as needed. 11/20/23   Historical Provider, MD  metoprolol  tartrate (LOPRESSOR ) 50 mg tablet Take one half tablet (25 mg dose) by mouth 2 (two) times daily. 07/17/23   Historical Provider, MD  pancrelipase, lipase-protease-amylase, (CREON) 36000 units CPEP DR capsule Take 36,000 Units by mouth 3 (three) times daily with meals. Patient not taking: Reported on 02/20/2024    Historical Provider, MD  tamsulosin (FLOMAX) 0.4 mg CAPS Take one capsule (0.4 mg dose) by mouth daily. 01/16/23   Historical Provider, MD  torsemide (DEMADEX) 20 mg tablet Take two tablets (40 mg dose) by mouth daily. 02/08/24   Historical Provider, MD      Results    Labs:  Lab Results  Component Value Date   Creatinine 7.03 (H) 02/23/2024   PTT 33 02/22/2024   PT 18.5 (H) 02/22/2024   INR 1.6 02/22/2024   Plt Ct 178 02/23/2024   WBC 8.4 02/23/2024   HGB 8.4 (L) 02/23/2024   HCT 25.2 (L) 02/23/2024   MCV 84.8 02/23/2024    BMP: No results found for this or any previous visit (from the past 12 weeks).    Physical Examination  Temp:  [97.6 F (36.4 C)-98.6 F (37 C)] 97.9 F (36.6 C) Heart Rate:  [72-102] 102 Resp:  [16-21] 18 BP: (109-124)/(53-67) 109/67 SpO2:  [90 %-98 %] 98 % O2 Device: None (Room air)  No data recorded   General Appearance:   No distress, pleasant, appears stated age  HEENT:   Normocephalic, atraumatic, non-icteric, external ears and gross hearing within normal limits  Neck:  no masses or asymmetries noted  Lungs:    Clear to auscultation bilaterally, normal effort  Heart:   Regular rate and rhythm, no murmur, rub or gallop  Abdomen:    Soft, non-tender, non-distended   Psychiatric:   patient alert and oriented x 3, recent and remote memory intact  Extremities:  Distal motor and sensation intact     ASA Classification: ASA 3 - Patient with moderate systemic disease with functional limitations  Mallampati Airway Classification: III (soft and hard palate and base of uvula visible)  Assessment  Immediate Pre-Procedural Assessment: no changes, acceptable for procedure  Sedation Plan: Moderate sedation  Consent: The procedure,its common risks, benefits, and alternatives were discussed with the patient. His questions were answered.  The consent form was signed and witnessed.    Electronically signed: Hubert JINNY Gun, PA-C 02/23/2024 / 2:39 PM          [1] Patient Active Problem List Diagnosis   Abdominal visceral abscess (*)   Perforated sigmoid diverticulitis with abscess   ESRD on dialysis (*)   T2DM (type 2 diabetes mellitus) (*)   Hypertension   Chronic hepatitis C with cirrhosis, treated   Gout   Anemia of chronic disease  [2] Social History Socioeconomic History   Marital status: Married  Tobacco Use   Smoking status: Never   Smokeless tobacco: Never  Substance and Sexual Activity   Alcohol use: No   Drug use: No  [3] No family history on file. [4] No Known Allergies "

## 2024-02-23 NOTE — Procedures (Signed)
 [facility]    Vascular and Interventional  Radiology  Brief Procedure Note    Procedure Performed:   Abscess Tube Upsize  Procedure performed by: Peyton LOISE Pal, PA-C Assistant(s): none  Procedure Date:  02/23/2024   Pre-Procedure Diagnosis:  Problem List[1] Post-Procedure Diagnosis:  Same    Procedure Summary   Anesthesia:  local and moderate procedural sedation.  IV sedation medications: midazolam  and fentanyl   See the EMR for details.  Estimated Blood Loss: Minimal  Specimen:  * No specimens in log * None  Drain(s): pigtail drainage catheter;   14 F multipurpose, midline pelvis  Complications: none  Implants: * No implants in log *                                                                                                                                                                                                                                                                                       Procedure Detail   Findings/Description:    Successful upsize of pelvic abscess drain.  Imaging is consistent with hematoma and most of it has not liquified yet. OP may be minimal but as the hematoma breaks down, it will continue to drain  Full procedural report:  pending   Electronically signed: Allyson N Valentine, PA-C 02/23/2024 / 4:11 PM         [1] Patient Active Problem List Diagnosis   Abdominal visceral abscess (*)   Perforated sigmoid diverticulitis with abscess   ESRD on dialysis (*)   T2DM (type 2 diabetes mellitus) (*)   Hypertension   Chronic hepatitis C with cirrhosis, treated   Gout   Anemia of chronic disease

## 2024-02-26 NOTE — Progress Notes (Signed)
 NICS Progress Note [facility] General Medicine Progress Note  Date of Admission:  02/20/2024 Length of Stay:  6 Days  Summary  Timothy Lane is a 74 year old male with history of EtOH abuse, BPH, type 2 diabetes mellitus, gout, hypertension, hyperlipidemia, chronic hep C treated, history of cirrhosis of liver, end-stage renal disease on dialysis Monday ,Fridays. He presented to [facility] in [place] for abdominal pain for 8 days. CT abdomen/pelvis without IV contrast showed organized air-fluid collection within the pelvis suggestive of hollow viscus perforation. Surrounding small bowel thickening and sigmoid colon wall thickening or either reactive or suggestive of perforated enteritis or sigmoid diverticulitis. General surgery recommended IR guided abscess drainage. He was transferred to [facility] for further IR intervention for pelvic abscess. IR, general surgery and nephrology have been consulted.   Reasons for continued hospitalization: On going infection, bowel Perforation, Might need surgical intervention   Disposition to: Home when stable   Timing of discharge: TBD  1/20 s/p JP abscess drain upsizing by IR   CT abdomen pelvis 1/23-Similar size large pelvic abscess measuring 11.3 x 7.4 x 8.0 cm. Subtle hyperattenuation within the collection which could be enteric or vascular in origin.  Similar small bowel obstruction with transition point adjacent to the pelvic abscess.  Labs 1/23-WBC 10.8, hemoglobin 9.2, BUN 52, creatinine 9.9    Subjective/Events Overnight: Seen at hemodialysis which was uneventful.  He is being scheduled for robotic small bowel resection with colostomy creation on 1/24 by general surgery.  Nephrology notes reviewed .     Assessment/Plan:   Principal Problem:   Perforated sigmoid diverticulitis with abscess Active Problems:   Abdominal visceral abscess (*)   ESRD on dialysis (*)   T2DM (type 2 diabetes mellitus) (*)    Hypertension   Chronic hepatitis C with cirrhosis, treated   Gout   Anemia of chronic disease   Plan: Perforated sigmoid diverticulitis with abscess -General surgery consulted on admission recommended IR abscess drain  -IR consulted and performed CT guided pelvic drain placement on 1/18 JP abscess drain was upsized today 1/ -Patient developed 10/10 pain after drain placement along with episode of fever  -STAT CT performed on 1/18 showed probable acute hemorrhage inside the left pelvic abscess, Increase in size of the left pelvic abscess, Worsening small bowel obstruction.  -General surgery evaluated patient and he was given TXA and Vit K, and placed NPO status. -heparin  held  -IR re consulted on 1/19 for drain repositioning - consider ex-lap/pelvic abscess washout, possible Hartmann's procedure if conservative mgt w/ drain/bowel rest and repositioning IR guided JP drain is unsuccessful per General surgery  -Continue Zosyn -Continue Scheduled Tylenol  PRN pain management with IV dilaudid  for severe, oral Roxicodone  for moderate,  - GI soft diet as tolerated -Repeat CT abdomen pelvis on Friday 1/23 to assess abscess size per surgery  ESRD on dialysis Iron deficiency anemia of chronic disease  -Nephrology consulted and patient received hemodialysis 1/19 -Continue iron supplementation - Monitor H&H serially and transfuse to keep hemoglobin greater than 7.0   HTN controlled -Continue Norvasc -Hold HCTZ  -prn hydralazine    DM 2 with  controlled  hyperglycemia -A1C 6.6 - SSI coverage  -Accu-Cheks AC /HS   Gout -Continue allopurinol    Chronic hepatitis C with cirrhosis -Patient has a history of alcohol abuse and chronic hep C treated.  -Repeat CMP  GI prophylaxis-PPI DVT prophy-cautious subcu heparin  but hold if hemoglobin drops or patient develops rectal bleed  Fluids, electrolytes, nutrition  Fluids: Diet: GI Soft/Low Fiber Diet NPO time specified   Prophylaxis DVT:         Disposition Home vs SNF   I have discussed the diagnoses and care plan with the patient     I personally viewed the chart :  medical history and diagnosis, vital signs, labs, diagnostic imagining and interpretations, EKG, medications and orders. Addressed any pertinent results. Details found in plan and assessment.      Objective   Vital signs in last 24 hours:  Temp:  [97.2 F (36.2 C)-99 F (37.2 C)] 97.4 F (36.3 C) Heart Rate:  [55-85] 84 Resp:  [16-20] 18 BP: (95-135)/(51-69) 103/51 SpO2:  [89 %-98 %] 97 % O2 Flow Rate (L/min): 2 L/min  Wt Readings from Last 1 Encounters:  02/20/24 93 kg (205 lb)    Weight change:    Physical Exam  Constitutional - resting comfortably, no acute distress CV - (+)S1S2, no murmurs, no peripheral edema, no JVD  Resp - CTA bilaterally, no wheezing or crackles, no clubbing, cyanosis  GI -soft, nontender, not distended + bowel sounds Skin - no rashes or wounds Neuro - alert, aware, oriented to person/place/time  Psych:-Normal mood      Recent Labs    Units 02/26/24 0140  WBC 10e3cells/uL 10.8*  HGB gm/dL 9.2*  HCT % 71.3*  PLT 10e3cells/uL 241   Recent Labs    Units 02/25/24 0435  NA mmol/L 137  K mmol/L 4.4  CL mmol/L 92*  CO2 mmol/L 20  BUN mg/dL 52*  CREATININE mg/dL 0.03*  CALCIUM mg/dL 8.5*   No results for input(s): MAGNESIUM in the last 168 hours. Recent Labs    Units 02/21/24 0606  HGBA1C % 6.6*   Recent Labs    Units 02/25/24 0435 02/23/24 0427 02/22/24 0805  BILITOT mg/dL  --   --  1.2  AST IU/L  --   --  26  ALT IU/L  --   --  18  ALKPHOS IU/L  --   --  74  ALBUMIN gm/dL 2.7*   < > 2.7*   < > = values in this interval not displayed.   Recent Labs    Units 02/22/24 0805  INR  1.6  PTT second(s) 33   No results for input(s): CHOL, LDL, HDL, TRIG in the last 168 hours. Recent Labs    Units 02/26/24 0737 02/25/24 2033 02/25/24 1532 02/25/24 1354 02/25/24 1157 02/25/24 0734   GLUCOSE mg/dL 836* 758* 768* 760* 803* 119*   No results for input(s): TROPONIN, CK in the last 168 hours.  Invalid input(s): CK-MB No results for input(s): BNP in the last 168 hours.   Current Medications   acetaminophen   1,000 mg Oral Q8H [id number]   allopurinol  100 mg Oral Daily   amLODIPine besylate  10 mg Oral Daily   epoetin alfa-epbx  7,000 Units IntraVENous Once per day on Monday Friday   ferrous sulfate  325 mg Oral BRK   gabapentin   100 mg Oral TID   heparin   5,000 Units Subcutaneous Q12H SCH   [Held by Provider] hydroCHLOROthiazide   25 mg Oral Daily   insulin  lispro (HUMALOG,ADMELOG)  1-5 Units Subcutaneous MealsHS   methocarbamol   500 mg Oral QID   piperacillin-tazabactam  4.5 g IntraVENous Q12H SCH    albuterol sulfate, alteplase (CATHFLO) injection, alteplase (CATHFLO) injection, calcium carbonate, dextrose  **OR** dextrose  **OR** dextrose  **OR** glucagon, diphenhydrAMINE , diphenoxylate-atropine, guaiFENesin , hydrALAZINE  HCl, melatonin, NaCl, NaCl, NaCl, NaCl, ondansetron  **OR** ondansetron , ondansetron ,  oxyCODONE  HCl, polyethylene glycol, sodium citrate 4% and gentamicin sulfate 0.96 mg/3 mL, sodium citrate 4% and gentamicin sulfate 0.96 mg/3 mL MDM-high complexity Marshel LILLETTE Hightower, MD  02/26/2024 1:06 PM
